# Patient Record
Sex: Female | Born: 1970
Health system: Southern US, Community
[De-identification: ages and names within clinical notes are randomized; demographics above are authoritative.]

## PROBLEM LIST (undated history)

## (undated) DIAGNOSIS — R87619 Unspecified abnormal cytological findings in specimens from cervix uteri: Secondary | ICD-10-CM

## (undated) DIAGNOSIS — N83209 Unspecified ovarian cyst, unspecified side: Secondary | ICD-10-CM

## (undated) DIAGNOSIS — D649 Anemia, unspecified: Secondary | ICD-10-CM

## (undated) DIAGNOSIS — F419 Anxiety disorder, unspecified: Secondary | ICD-10-CM

## (undated) DIAGNOSIS — G43909 Migraine, unspecified, not intractable, without status migrainosus: Secondary | ICD-10-CM

## (undated) DIAGNOSIS — F1910 Other psychoactive substance abuse, uncomplicated: Secondary | ICD-10-CM

## (undated) DIAGNOSIS — Z800 Family history of malignant neoplasm of digestive organs: Secondary | ICD-10-CM

## (undated) DIAGNOSIS — Z5189 Encounter for other specified aftercare: Secondary | ICD-10-CM

## (undated) DIAGNOSIS — I10 Essential (primary) hypertension: Secondary | ICD-10-CM

## (undated) DIAGNOSIS — E785 Hyperlipidemia, unspecified: Secondary | ICD-10-CM

## (undated) HISTORY — DX: Anemia, unspecified: D64.9

## (undated) HISTORY — PX: TUBAL LIGATION: SHX77

## (undated) HISTORY — DX: Other psychoactive substance abuse, uncomplicated: F19.10

## (undated) HISTORY — DX: Anxiety disorder, unspecified: F41.9

## (undated) HISTORY — PX: CHOLECYSTECTOMY: SHX55

## (undated) HISTORY — DX: Family history of malignant neoplasm of digestive organs: Z80.0

## (undated) HISTORY — DX: Unspecified ovarian cyst, unspecified side: N83.209

## (undated) HISTORY — DX: Hyperlipidemia, unspecified: E78.5

## (undated) HISTORY — DX: Encounter for other specified aftercare: Z51.89

## (undated) HISTORY — PX: CERVICAL BIOPSY  W/ LOOP ELECTRODE EXCISION: SUR135

## (undated) HISTORY — DX: Unspecified abnormal cytological findings in specimens from cervix uteri: R87.619

## (undated) HISTORY — DX: Migraine, unspecified, not intractable, without status migrainosus: G43.909

---

## 2011-04-24 HISTORY — PX: DEEP NECK LYMPH NODE BIOPSY / EXCISION: SUR126

## 2019-04-24 DIAGNOSIS — Z5189 Encounter for other specified aftercare: Secondary | ICD-10-CM

## 2019-04-24 HISTORY — DX: Encounter for other specified aftercare: Z51.89

## 2019-06-08 ENCOUNTER — Ambulatory Visit: Payer: Self-pay | Attending: Internal Medicine

## 2019-06-08 DIAGNOSIS — Z20822 Contact with and (suspected) exposure to covid-19: Secondary | ICD-10-CM

## 2019-06-09 LAB — NOVEL CORONAVIRUS, NAA: SARS-CoV-2, NAA: NOT DETECTED

## 2019-07-16 ENCOUNTER — Ambulatory Visit: Payer: Self-pay

## 2019-07-17 ENCOUNTER — Ambulatory Visit: Payer: Self-pay | Attending: Internal Medicine

## 2019-07-17 DIAGNOSIS — Z20822 Contact with and (suspected) exposure to covid-19: Secondary | ICD-10-CM

## 2019-07-18 LAB — SARS-COV-2, NAA 2 DAY TAT

## 2019-07-18 LAB — NOVEL CORONAVIRUS, NAA: SARS-CoV-2, NAA: NOT DETECTED

## 2019-09-03 ENCOUNTER — Encounter (HOSPITAL_COMMUNITY): Payer: Self-pay

## 2019-09-03 ENCOUNTER — Ambulatory Visit (HOSPITAL_COMMUNITY)
Admission: EM | Admit: 2019-09-03 | Discharge: 2019-09-03 | Disposition: A | Discharge status: Home / Self Care | Payer: No Typology Code available for payment source | Attending: Family Medicine | Admitting: Family Medicine

## 2019-09-03 ENCOUNTER — Ambulatory Visit (INDEPENDENT_AMBULATORY_CARE_PROVIDER_SITE_OTHER): Payer: No Typology Code available for payment source

## 2019-09-03 ENCOUNTER — Other Ambulatory Visit: Payer: Self-pay

## 2019-09-03 DIAGNOSIS — M25512 Pain in left shoulder: Secondary | ICD-10-CM | POA: Diagnosis not present

## 2019-09-03 DIAGNOSIS — S161XXA Strain of muscle, fascia and tendon at neck level, initial encounter: Secondary | ICD-10-CM

## 2019-09-03 HISTORY — DX: Essential (primary) hypertension: I10

## 2019-09-03 MED ORDER — IBUPROFEN 800 MG PO TABS: 800.0000 mg | ORAL_TABLET | Freq: Three times a day (TID) | ORAL | 0 refills | Status: DC

## 2019-09-03 MED ORDER — CYCLOBENZAPRINE HCL 10 MG PO TABS: ORAL_TABLET | ORAL | 0 refills | Status: DC
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
## 2019-10-26 ENCOUNTER — Ambulatory Visit: Payer: No Typology Code available for payment source | Attending: Internal Medicine

## 2019-10-26 DIAGNOSIS — Z20822 Contact with and (suspected) exposure to covid-19: Secondary | ICD-10-CM

## 2019-10-27 LAB — SARS-COV-2, NAA 2 DAY TAT

## 2019-10-27 LAB — NOVEL CORONAVIRUS, NAA: SARS-CoV-2, NAA: NOT DETECTED

## 2020-04-28 ENCOUNTER — Other Ambulatory Visit: Payer: No Typology Code available for payment source

## 2020-05-20 ENCOUNTER — Encounter: Payer: Self-pay | Admitting: Family Medicine

## 2020-05-20 ENCOUNTER — Ambulatory Visit (INDEPENDENT_AMBULATORY_CARE_PROVIDER_SITE_OTHER): Payer: No Typology Code available for payment source | Admitting: Family Medicine

## 2020-05-20 ENCOUNTER — Ambulatory Visit: Payer: Self-pay | Admitting: Family Medicine

## 2020-05-20 ENCOUNTER — Other Ambulatory Visit: Payer: Self-pay | Admitting: Family Medicine

## 2020-05-20 ENCOUNTER — Other Ambulatory Visit: Payer: Self-pay

## 2020-05-20 VITALS — BP 140/70 | HR 70 | Ht 64.5 in | Wt 154.0 lb

## 2020-05-20 DIAGNOSIS — Z13220 Encounter for screening for lipoid disorders: Secondary | ICD-10-CM

## 2020-05-20 DIAGNOSIS — Z0000 Encounter for general adult medical examination without abnormal findings: Secondary | ICD-10-CM

## 2020-05-20 DIAGNOSIS — Z124 Encounter for screening for malignant neoplasm of cervix: Secondary | ICD-10-CM

## 2020-05-20 DIAGNOSIS — Z1159 Encounter for screening for other viral diseases: Secondary | ICD-10-CM

## 2020-05-20 DIAGNOSIS — Z1231 Encounter for screening mammogram for malignant neoplasm of breast: Secondary | ICD-10-CM | POA: Diagnosis not present

## 2020-05-20 DIAGNOSIS — Z833 Family history of diabetes mellitus: Secondary | ICD-10-CM

## 2020-05-20 DIAGNOSIS — Z7185 Encounter for immunization safety counseling: Secondary | ICD-10-CM

## 2020-05-20 DIAGNOSIS — Z1211 Encounter for screening for malignant neoplasm of colon: Secondary | ICD-10-CM | POA: Diagnosis not present

## 2020-05-20 DIAGNOSIS — D649 Anemia, unspecified: Secondary | ICD-10-CM

## 2020-05-20 DIAGNOSIS — N920 Excessive and frequent menstruation with regular cycle: Secondary | ICD-10-CM | POA: Diagnosis not present

## 2020-05-20 DIAGNOSIS — Z1329 Encounter for screening for other suspected endocrine disorder: Secondary | ICD-10-CM

## 2020-05-20 DIAGNOSIS — Z8616 Personal history of COVID-19: Secondary | ICD-10-CM

## 2020-05-20 DIAGNOSIS — I10 Essential (primary) hypertension: Secondary | ICD-10-CM

## 2020-05-20 MED ORDER — AMLODIPINE BESYLATE 10 MG PO TABS: 10.0000 mg | ORAL_TABLET | Freq: Every day | ORAL | 1 refills | Status: DC

## 2020-05-20 MED FILL — AMLODIPINE BESYLATE 10 MG T: 10 | 90 days supply | Qty: 90 | Fill #0

## 2020-05-20 NOTE — Progress Notes (Signed)
Subjective:    Patient ID: Pamela Todd, female    DOB: April 21, 1971, 50 y.o.   MRN: [id number]  HPI Chief Complaint  Patient presents with  . new pt    New pt cpe, been with bp meds for a while. Would like referral to gyn   She is new to the practice and here for a complete physical exam. Previous medical care: moved here from [place] 3 years ago   HTN- has been out of her amlodipine 10 mg for 2 months.   History of anemia 4 years ago due to menstrual bleeding. Had a blood transfusion in the past.  States periods are heavier than in the past.  Requests referral to gynecologist.   Social history: Lives with , works as a Biomedical engineer in oncology at [employer]  Denies smoking, drinking alcohol, drug use  Diet: eats whatever she wants  Excerise: not often   Immunizations: Covid vaccines received. Declines flu shot.   Health maintenance:  Mammogram: 6 years ago  Colonoscopy: never  Last Gynecological Exam: overdue  Last Menstrual cycle: currently on cycle. Periods heavier than usual.  Last Dental Exam: overdue Last Eye Exam: 3 months   Wears seatbelt always, smoke detectors in home and functioning, does not text while driving and feels safe in home environment.   Reviewed allergies, medications, past medical, surgical, family, and social history.   Review of Systems Review of Systems Constitutional: -fever, -chills, -sweats, -unexpected weight change,-fatigue ENT: -runny nose, -ear pain, -sore throat Cardiology:  -chest pain, -palpitations, -edema Respiratory: -cough, -shortness of breath, -wheezing Gastroenterology: -abdominal pain, -nausea, -vomiting, -diarrhea, -constipation  Hematology: -bleeding or bruising problems Musculoskeletal: -arthralgias, -myalgias, -joint swelling, -back pain Ophthalmology: -vision changes Urology: -dysuria, -difficulty urinating, -hematuria, -urinary frequency, -urgency Neurology: -headache, -weakness, -tingling, -numbness       Objective:   Physical Exam BP 140/70   Pulse 70   Ht 5' 4.5" (1.638 m)   Wt 154 lb (69.9 kg)   LMP 04/20/2020   BMI 26.03 kg/m   General Appearance:    Alert, cooperative, no distress, appears stated age  Head:    Normocephalic, without obvious abnormality, atraumatic  Eyes:    PERRL, conjunctiva/corneas clear, EOM's intact  Ears:    Normal TM's and external ear canals  Nose:   Mask on   Throat:   Mask on   Neck:   Supple, no lymphadenopathy;  thyroid:  no   enlargement/tenderness/nodules; no JVD  Back:    Spine nontender, no curvature, ROM normal, no CVA     tenderness  Lungs:     Clear to auscultation bilaterally without wheezes, rales or     ronchi; respirations unlabored  Chest Wall:    No tenderness or deformity   Heart:    Regular rate and rhythm, S1 and S2 normal, no murmur, rub   or gallop  Breast Exam:    OB/GYN   Abdomen:     Soft, non-tender, nondistended, normoactive bowel sounds,    no masses, no hepatosplenomegaly  Genitalia:    OB/GYN      Extremities:   No clubbing, cyanosis or edema  Pulses:   2+ and symmetric all extremities  Skin:   Skin color, texture, turgor normal, no rashes or lesions  Lymph nodes:   Cervical, supraclavicular, and axillary nodes normal  Neurologic:   CNII-XII intact, normal strength, sensation and gait; reflexes 2+ and symmetric throughout          Psych:  Normal mood, affect, hygiene and grooming.        Assessment & Plan:  Routine general medical examination at a health care facility - Plan: CBC with Differential/Platelet, Comprehensive metabolic panel, TSH, T4, free, T3, Lipid panel -She is new to the practice and here today for a fasting CPE.  Preventive health care deficiencies. She will call and schedule her mammogram at the breast center.  I am referring her to [facility] gynecology per her request. Referral made to GI for her for screening colonoscopy. She will call and schedule with a local dentist and eye  doctor. Counseling on healthy lifestyle including diet and exercise. Immunizations reviewed Discussed safety and health promotion.  Primary hypertension - Plan: CBC with Differential/Platelet, Comprehensive metabolic panel, amLODipine (NORVASC) 10 MG tablet -She will start back on amlodipine and monitor blood pressure.  Recommend low-sodium diet.  Follow-up pending lab results  Anemia, unspecified type - Plan: CBC with Differential/Platelet, Iron, TIBC and Ferritin Panel -She is not currently taking iron.  History of blood transfusion.  Her menstrual cycles are much heavier now that she is closer to menopause.  I will check CBC and iron studies and follow-up  Encounter for screening mammogram for malignant neoplasm of breast - Plan: MM DIGITAL SCREENING BILATERAL -She will call and schedule her mammogram  Screen for colon cancer - Plan: Ambulatory referral to Gastroenterology -Done per screening guidelines  Immunization counseling  Family history of diabetes mellitus (DM) - Plan: Hemoglobin A1c -Recommend low sugar and low carbohydrate diet and increasing physical activity.  Need for hepatitis C screening test - Plan: Hepatitis C antibody  History of COVID-19 -Fully recovered  Screening for lipid disorders - Plan: Lipid panel  Screening for thyroid disorder - Plan: TSH, T4, free, T3  Screening for cervical cancer - Plan: Ambulatory referral to Gynecology  Menorrhagia with regular cycle - Plan: Ambulatory referral to Gynecology

## 2020-05-20 NOTE — Patient Instructions (Addendum)
It was a pleasure meeting you today.  Call and schedule your mammogram at the breast center.  You will hear from [facility] gastroenterology and [facility] gynecology.  Try to get at least 150 minutes of physical activity each week.  Limit sodium, foods high in fat and carbohydrates.  Start back on your amlodipine and check your blood pressure periodically. Goal blood pressure reading is less than 130/80. Let me know if it is not consistently lower than this. It may take a few days to get it to normal range.  Call and schedule an eye exam.  Call and schedule a dental exam.  We will be in touch with your lab results.  You may return at your convenience for a nurse visit to get your Covid booster.      Preventive Care 43-52 Years Old, Female Preventive care refers to lifestyle choices and visits with your health care provider that can promote health and wellness. This includes:  A yearly physical exam. This is also called an annual wellness visit.  Regular dental and eye exams.  Immunizations.  Screening for certain conditions.  Healthy lifestyle choices, such as: ? Eating a healthy diet. ? Getting regular exercise. ? Not using drugs or products that contain nicotine and tobacco. ? Limiting alcohol use. What can I expect for my preventive care visit? Physical exam Your health care provider will check your:  Height and weight. These may be used to calculate your BMI (body mass index). BMI is a measurement that tells if you are at a healthy weight.  Heart rate and blood pressure.  Body temperature.  Skin for abnormal spots. Counseling Your health care provider may ask you questions about your:  Past medical problems.  Family's medical history.  Alcohol, tobacco, and drug use.  Emotional well-being.  Home life and relationship well-being.  Sexual activity.  Diet, exercise, and sleep habits.  Work and work Statistician.  Access to firearms.  Method of  birth control.  Menstrual cycle.  Pregnancy history. What immunizations do I need? Vaccines are usually given at various ages, according to a schedule. Your health care provider will recommend vaccines for you based on your age, medical history, and lifestyle or other factors, such as travel or where you work.   What tests do I need? Blood tests  Lipid and cholesterol levels. These may be checked every 5 years, or more often if you are over 15 years old.  Hepatitis C test.  Hepatitis B test. Screening  Lung cancer screening. You may have this screening every year starting at age 28 if you have a 30-pack-year history of smoking and currently smoke or have quit within the past 15 years.  Colorectal cancer screening. ? All adults should have this screening starting at age 72 and continuing until age 54. ? Your health care provider may recommend screening at age 30 if you are at increased risk. ? You will have tests every 1-10 years, depending on your results and the type of screening test.  Diabetes screening. ? This is done by checking your blood sugar (glucose) after you have not eaten for a while (fasting). ? You may have this done every 1-3 years.  Mammogram. ? This may be done every 1-2 years. ? Talk with your health care provider about when you should start having regular mammograms. This may depend on whether you have a family history of breast cancer.  BRCA-related cancer screening. This may be done if you have a family history of  breast, ovarian, tubal, or peritoneal cancers.  Pelvic exam and Pap test. ? This may be done every 3 years starting at age 104. ? Starting at age 61, this may be done every 5 years if you have a Pap test in combination with an HPV test. Other tests  STD (sexually transmitted disease) testing, if you are at risk.  Bone density scan. This is done to screen for osteoporosis. You may have this scan if you are at high risk for osteoporosis. Talk  with your health care provider about your test results, treatment options, and if necessary, the need for more tests. Follow these instructions at home: Eating and drinking  Eat a diet that includes fresh fruits and vegetables, whole grains, lean protein, and low-fat dairy products.  Take vitamin and mineral supplements as recommended by your health care provider.  Do not drink alcohol if: ? Your health care provider tells you not to drink. ? You are pregnant, may be pregnant, or are planning to become pregnant.  If you drink alcohol: ? Limit how much you have to 0-1 drink a day. ? Be aware of how much alcohol is in your drink. In the [place] one drink equals one 12 oz bottle of beer (355 mL), one 5 oz glass of wine (148 mL), or one 1 oz glass of hard liquor (44 mL).   Lifestyle  Take daily care of your teeth and gums. Brush your teeth every morning and night with fluoride toothpaste. Floss one time each day.  Stay active. Exercise for at least 30 minutes 5 or more days each week.  Do not use any products that contain nicotine or tobacco, such as cigarettes, e-cigarettes, and chewing tobacco. If you need help quitting, ask your health care provider.  Do not use drugs.  If you are sexually active, practice safe sex. Use a condom or other form of protection to prevent STIs (sexually transmitted infections).  If you do not wish to become pregnant, use a form of birth control. If you plan to become pregnant, see your health care provider for a prepregnancy visit.  If told by your health care provider, take low-dose aspirin daily starting at age 94.  Find healthy ways to cope with stress, such as: ? Meditation, yoga, or listening to music. ? Journaling. ? Talking to a trusted person. ? Spending time with friends and family. Safety  Always wear your seat belt while driving or riding in a vehicle.  Do not drive: ? If you have been drinking alcohol. Do not ride with someone who has  been drinking. ? When you are tired or distracted. ? While texting.  Wear a helmet and other protective equipment during sports activities.  If you have firearms in your house, make sure you follow all gun safety procedures. What's next?  Visit your health care provider once a year for an annual wellness visit.  Ask your health care provider how often you should have your eyes and teeth checked.  Stay up to date on all vaccines. This information is not intended to replace advice given to you by your health care provider. Make sure you discuss any questions you have with your health care provider. Document Revised: 01/12/2020 Document Reviewed: 12/19/2017 Elsevier Patient Education  2021 [employer].

## 2020-05-21 LAB — HEMOGLOBIN A1C
Est. average glucose Bld gHb Est-mCnc: 120 mg/dL
Hgb A1c MFr Bld: 5.8 % — ABNORMAL HIGH (ref 4.8–5.6)

## 2020-05-21 LAB — LIPID PANEL
Chol/HDL Ratio: 4.8 ratio — ABNORMAL HIGH (ref 0.0–4.4)
Cholesterol, Total: 238 mg/dL — ABNORMAL HIGH (ref 100–199)
HDL: 50 mg/dL (ref >39)
LDL Chol Calc (NIH): 167 mg/dL — ABNORMAL HIGH (ref 0–99)
Triglycerides: 116 mg/dL (ref 0–149)
VLDL Cholesterol Cal: 21 mg/dL (ref 5–40)

## 2020-05-21 LAB — COMPREHENSIVE METABOLIC PANEL
ALT: 14 IU/L (ref 0–32)
AST: 17 IU/L (ref 0–40)
Albumin/Globulin Ratio: 1.3 (ref 1.2–2.2)
Albumin: 4.1 g/dL (ref 3.8–4.9)
Alkaline Phosphatase: 77 IU/L (ref 44–121)
BUN/Creatinine Ratio: 10 (ref 9–23)
BUN: 6 mg/dL (ref 6–24)
Bilirubin Total: 0.3 mg/dL (ref 0.0–1.2)
CO2: 23 mmol/L (ref 20–29)
Calcium: 9.7 mg/dL (ref 8.7–10.2)
Chloride: 104 mmol/L (ref 96–106)
Creatinine, Ser: 0.63 mg/dL (ref 0.57–1.00)
GFR calc Af Amer: 120 mL/min/{1.73_m2} (ref >59)
GFR calc non Af Amer: 104 mL/min/{1.73_m2} (ref >59)
Globulin, Total: 3.2 g/dL (ref 1.5–4.5)
Glucose: 108 mg/dL — ABNORMAL HIGH (ref 65–99)
Potassium: 4.4 mmol/L (ref 3.5–5.2)
Sodium: 141 mmol/L (ref 134–144)
Total Protein: 7.3 g/dL (ref 6.0–8.5)

## 2020-05-21 LAB — IRON,TIBC AND FERRITIN PANEL
Ferritin: 9 ng/mL — ABNORMAL LOW (ref 15–150)
Iron Saturation: 8 % — CL (ref 15–55)
Iron: 29 ug/dL (ref 27–159)
Total Iron Binding Capacity: 385 ug/dL (ref 250–450)
UIBC: 356 ug/dL (ref 131–425)

## 2020-05-21 LAB — TSH: TSH: 1.94 u[IU]/mL (ref 0.450–4.500)

## 2020-05-21 LAB — CBC WITH DIFFERENTIAL/PLATELET
Basophils Absolute: 0 10*3/uL (ref 0.0–0.2)
Basos: 1 %
EOS (ABSOLUTE): 0.4 10*3/uL (ref 0.0–0.4)
Eos: 7 %
Hematocrit: 36.5 % (ref 34.0–46.6)
Hemoglobin: 11.2 g/dL (ref 11.1–15.9)
Immature Grans (Abs): 0 10*3/uL (ref 0.0–0.1)
Immature Granulocytes: 0 %
Lymphocytes Absolute: 2.3 10*3/uL (ref 0.7–3.1)
Lymphs: 42 %
MCH: 22.9 pg — ABNORMAL LOW (ref 26.6–33.0)
MCHC: 30.7 g/dL — ABNORMAL LOW (ref 31.5–35.7)
MCV: 75 fL — ABNORMAL LOW (ref 79–97)
Monocytes Absolute: 0.4 10*3/uL (ref 0.1–0.9)
Monocytes: 8 %
Neutrophils Absolute: 2.3 10*3/uL (ref 1.4–7.0)
Neutrophils: 42 %
Platelets: 531 10*3/uL — ABNORMAL HIGH (ref 150–450)
RBC: 4.89 x10E6/uL (ref 3.77–5.28)
RDW: 15.3 % (ref 11.7–15.4)
WBC: 5.4 10*3/uL (ref 3.4–10.8)

## 2020-05-21 LAB — HEPATITIS C ANTIBODY: Hep C Virus Ab: <0.1 s/co ratio (ref 0.0–0.9)

## 2020-05-21 LAB — T4, FREE: Free T4: 1.13 ng/dL (ref 0.82–1.77)

## 2020-05-21 LAB — T3: T3, Total: 165 ng/dL (ref 71–180)

## 2020-05-23 ENCOUNTER — Encounter: Payer: Self-pay | Admitting: Family Medicine

## 2020-05-23 DIAGNOSIS — E7800 Pure hypercholesterolemia, unspecified: Secondary | ICD-10-CM | POA: Insufficient documentation

## 2020-05-23 DIAGNOSIS — R7303 Prediabetes: Secondary | ICD-10-CM

## 2020-05-23 DIAGNOSIS — E611 Iron deficiency: Secondary | ICD-10-CM

## 2020-05-23 DIAGNOSIS — D75839 Thrombocytosis, unspecified: Secondary | ICD-10-CM

## 2020-05-23 HISTORY — DX: Iron deficiency: E61.1

## 2020-05-23 HISTORY — DX: Prediabetes: R73.03

## 2020-05-23 HISTORY — DX: Pure hypercholesterolemia, unspecified: E78.00

## 2020-05-23 HISTORY — DX: Thrombocytosis, unspecified: D75.839

## 2020-05-23 NOTE — Progress Notes (Signed)
Please call her and let her know that her iron level is very low. I recommend that she take OTC iron daily. It may cause constipation so I recommend taking a stool softener with each iron dose Her platelets are also significantly elevated and we need to recheck these in approximately 4 weeks after she has been taking iron daily.  Also, she is in the prediabetes range with a Hgb A1c of 5.8%. Prediabetes is 5.7-6.4 and diabetes starts at 6.5%.  Her LDL is 167 which is also high and the goal is less than 100.  We can talk more about all of this at her follow up in 4 weeks but essentially this means that cutting back on sugar, carbohydrates and foods high in fat including fried foods will help improve her blood work and her overall health.

## 2020-06-02 ENCOUNTER — Other Ambulatory Visit (HOSPITAL_COMMUNITY)
Admission: RE | Admit: 2020-06-02 | Discharge: 2020-06-02 | Disposition: A | Discharge status: Home / Self Care | Payer: No Typology Code available for payment source | Source: Ambulatory Visit | Attending: Nurse Practitioner | Admitting: Nurse Practitioner

## 2020-06-02 ENCOUNTER — Other Ambulatory Visit: Payer: Self-pay | Admitting: Nurse Practitioner

## 2020-06-02 ENCOUNTER — Ambulatory Visit (INDEPENDENT_AMBULATORY_CARE_PROVIDER_SITE_OTHER): Payer: No Typology Code available for payment source | Admitting: Nurse Practitioner

## 2020-06-02 ENCOUNTER — Other Ambulatory Visit: Payer: Self-pay

## 2020-06-02 ENCOUNTER — Encounter: Payer: Self-pay | Admitting: Nurse Practitioner

## 2020-06-02 VITALS — BP 110/70 | HR 68 | Resp 16 | Ht 63.25 in | Wt 150.0 lb

## 2020-06-02 DIAGNOSIS — Z113 Encounter for screening for infections with a predominantly sexual mode of transmission: Secondary | ICD-10-CM | POA: Diagnosis present

## 2020-06-02 DIAGNOSIS — Z01419 Encounter for gynecological examination (general) (routine) without abnormal findings: Secondary | ICD-10-CM | POA: Diagnosis present

## 2020-06-02 DIAGNOSIS — N898 Other specified noninflammatory disorders of vagina: Secondary | ICD-10-CM | POA: Diagnosis not present

## 2020-06-02 DIAGNOSIS — N920 Excessive and frequent menstruation with regular cycle: Secondary | ICD-10-CM | POA: Diagnosis not present

## 2020-06-02 LAB — WET PREP FOR TRICH, YEAST, CLUE

## 2020-06-02 MED ORDER — NORETHINDRONE 0.35 MG PO TABS: 1.0000 | ORAL_TABLET | Freq: Every day | ORAL | 12 refills | Status: DC

## 2020-06-02 MED FILL — NORLYDA 0.35 MG TABS: 0.35 | 28 days supply | Qty: 28 | Fill #0

## 2020-06-02 NOTE — Progress Notes (Signed)
50 y.o. [id number] Seperated Black or African American female here for annual exam.   Moved from [place] 3 years ago, received GYN care last back home.  Lives with son, daughter in law and 2 grandchildren. 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  Wants to discuss a vaginal discharge (increased amount), no odor, no irritation, but a change for her. It has been going on about 1 year. Has new sexual partner, off/on  Periods regular about every 28 days, lasts about 7 days. Last period lasted 9 days. Periods are generally heavy x 3 days, last period was heavy for 5 days and also noticed cramps. Past 2-3 years periods have been getting heavier. No intermenstrual bleeding. Recent blood work with PCP hemoglobin 11.2    Patient's last menstrual period was 05/18/2020 (exact date).          Sexually active: Yes.    The current method of family planning is tubal ligation.    Exercising: No.  exercise Smoker:  no  Health Maintenance: Pap:  4-5 yrs ago History of abnormal Pap:  Yes, [place] 02/2015 MMG:  86yrs ago, patient to schedule Colonoscopy:  None, pcp is setting up appt BMD:   none TDaP:  2020 Gardasil:   n/a Covid-19: pfizer Hep C testing: neg 2022 Screening Labs: done with PCPs   reports that she has never smoked. She has never used smokeless tobacco. She reports previous alcohol use. She reports that she does not use drugs.  Past Medical History:  Diagnosis Date  . Abnormal Pap smear of cervix   . Anemia   . Anxiety   . Blood transfusion without reported diagnosis   . Elevated LDL cholesterol level 05/23/2020  . Hypertension   . Iron deficiency 05/23/2020  . Migraines   . Ovarian cyst   . Prediabetes 05/23/2020  . Substance abuse ([place])    trichomonas many yrs ago  . Thrombocytosis 05/23/2020    Past Surgical History:  Procedure Laterality Date  . CERVICAL BIOPSY  W/ LOOP ELECTRODE EXCISION     2016  . CHOLECYSTECTOMY    . DEEP NECK LYMPH NODE BIOPSY / EXCISION     . TUBAL LIGATION      Current Outpatient Medications  Medication Sig Dispense Refill  . amLODipine (NORVASC) 10 MG tablet Take 1 tablet (10 mg total) by mouth daily. 90 tablet 1  . Ferrous Sulfate (IRON PO) Take by mouth.     No current facility-administered medications for this visit.    Family History  Problem Relation Age of Onset  . Hypertension Mother   . Colon cancer Mother 3  . Heart disease Mother   . Hypertension Father   . Diabetes Sister   . Hypertension Sister   . Diabetes Brother   . Heart attack Brother   . Stroke Brother   . Diabetes Sister   . Diabetes Brother   . Heart disease Maternal Grandmother     Review of Systems  Constitutional: Negative.   HENT: Negative.   Eyes: Negative.   Respiratory: Negative.   Cardiovascular: Negative.   Gastrointestinal: Negative.   Endocrine: Negative.   Genitourinary: Negative.   Musculoskeletal: Negative.   Skin: Negative.   Allergic/Immunologic: Negative.   Neurological: Negative.   Hematological: Negative.   Psychiatric/Behavioral: Negative.     Exam:   BP 110/70   Pulse 68   Resp 16   Ht 5' 3.25" (1.607 m)   Wt 150 lb (68 kg)   LMP  05/18/2020 (Exact Date)   BMI 26.36 kg/m   Height: 5' 3.25" (160.7 cm)  General appearance: alert, cooperative and appears stated age, no acute distress Head: Normocephalic, without obvious abnormality Neck: no adenopathy, thyroid normal to inspection and palpation Lungs: clear to auscultation bilaterally Breasts: Normal to palpation without dominant masses, Taught monthly breast self examination Heart: regular rate and rhythm Abdomen: soft, non-tender; no masses,  no organomegaly Extremities: extremities normal, no edema Skin: No rashes or lesions Lymph nodes: Cervical, supraclavicular, and axillary nodes normal. No abnormal inguinal nodes palpated Neurologic: Grossly normal   Pelvic: External genitalia:  no lesions              Urethra:  normal appearing urethra  with no masses, tenderness or lesions              Bartholins and Skenes: normal                 Vagina: normal appearing vagina, appropriate for age, watery appearing discharge, no lesions              Cervix: soft cystic structure palpable and visible on cervix             Bimanual Exam:   Uterus:  Irregular in shape              Adnexa: no mass, fullness, tenderness              Rectal: no palpable mass  Joy, CMA Chaperone was present for exam.  A:  Well woman exam with routine gynecological exam - Plan: Cytology - PAP( [place])  Menorrhagia with regular cycle - Plan: norethindrone (MICRONOR) 0.35 MG tablet, US PELVIS TRANSVAGINAL NON-OB (TV ONLY)  Vaginal discharge - Plan: WET PREP FOR [place], YEAST, CLUE  Screening for STDs (sexually transmitted diseases) - Plan: WET PREP FOR [place], YEAST, CLUE, Cytology - PAP( [place])   P:   Pap : collected today/HPV, Hx LEEP 2016  Mammogram:Pt will schedule, discussed  Labs:GC/CT, wet mount today (blood testing recently done with PCP)  Medications: Norethindrone 0.35mg , will rx month to month in case pt does not tolerate. Advised after follow up can discuss management plan further with MD.  Briefly discussed IUD as possible option after [place], informational pamphlet provided

## 2020-06-02 NOTE — Patient Instructions (Addendum)
Menorrhagia Menorrhagia is when your monthly periods are heavy or last longer than normal. If you have this condition, bleeding and cramping may make it hard for you to do your daily activities. What are the causes? Common causes of this condition include:  Growths in the womb (uterus). These are polyps or fibroids. These growths are not cancer.  Problems with two hormones called estrogen and progesterone.  One of the ovaries not releasing an egg during one or more months.  A problem with the thyroid gland.  Having a device for birth control (IUD).  Side effects of some medicines, such as NSAIDs or blood thinners.  A disorder that stops the blood from clotting normally. What increases the risk? You are more likely to have this condition if you have cancer of the womb. What are the signs or symptoms?  Having to change your pad or tampon every 1-2 hours because it is soaked.  Needing to use pads and tampons at the same time because of heavy bleeding.  Needing to wake up to change your pads or tampons during the night.  Passing blood clots larger than 1 inch (2.5 cm) in size.  Having bleeding that lasts for more than 7 days.  Having symptoms of low iron levels (anemia), such as feeling tired or having shortness of breath. How is this treated? You may not need to be treated for this condition. But if you need treatment, you may be given medicines:  To reduce bleeding during your period. These include birth control medicines.  To make your blood thick. This slows bleeding.  To reduce swelling. Medicines that do this include ibuprofen.  That have a hormone called progestin.  That make the ovaries stop working for a short time.  To treat low iron levels. You will be given iron pills if you have this condition. If medicines do not work, surgery may be done. Surgery may be done to:  Remove a part of the lining of the womb. This lining is called the endometrium. This reduces  bleeding during a period.  Remove growths in the womb. These may be polyps or fibroids.  Remove the entire lining of the womb.  Remove the womb entirely. This procedure is called a hysterectomy.   Follow these instructions at home: Medicines  Take over-the-counter and prescription medicines only as told by your doctor. This includes iron pills.  Do not change or switch medicines without asking your doctor.  Do not take aspirin or medicines that contain aspirin 1 week before or during your period. Aspirin may make bleeding worse. Managing constipation Iron pills may cause trouble pooping (constipation). To prevent or treat problems when pooping, you may need to:  Drink enough fluid to keep your pee (urine) pale yellow.  Take over-the-counter or prescription medicines.  Eat foods that are high in fiber. These include beans, whole grains, and fresh fruits and vegetables.  Limit foods that are high in fat and sugar. These include fried or sweet foods. General instructions  If you need to change your pad or tampon more than once every 2 hours, limit your activity until the bleeding stops.  Eat healthy meals and foods that are high in iron. Foods that have a lot of iron include: ? Leafy green vegetables. ? Meat. ? Liver. ? Eggs. ? Whole-grain breads and cereals.  Do not try to lose weight until your heavy bleeding has stopped and you have normal amounts of iron in your blood. If you need to lose  weight, work with your doctor.  Keep all follow-up visits. Contact a doctor if:  You soak through a pad or tampon every 1 or 2 hours, and this happens every time you have a period.  You need to use pads and tampons at the same time because you are bleeding so much.  You are taking medicine, and: ? You feel like you may vomit. ? You vomit. ? You have watery poop (diarrhea).  You have other problems that may be related to the medicine you are taking. Get help right away if:  You  soak through more than a pad or tampon in 1 hour.  You pass clots bigger than 1 inch (2.5 cm) wide.  You feel short of breath.  You feel like your heart is beating too fast.  You feel dizzy or you faint.  You feel very weak or tired. Summary  Menorrhagia is when your menstrual periods are heavy or last longer than normal.  You may not need to be treated for this condition. If you need treatment, you may be given medicines or have surgery.  Take over-the-counter and prescription medicines only as told by your doctor. This includes iron pills.  Get help right away if you soak through more than a pad or tampon in 1 hour or you pass large clots. Also, get help right away if you feel dizzy, short of breath, or very weak or tired. This information is not intended to replace advice given to you by your health care provider. Make sure you discuss any questions you have with your health care provider. Document Revised: 12/22/2019 Document Reviewed: 12/22/2019 Elsevier Patient Education  2021 [employer]    Preventive Care 21-77 Years Old, Female Preventive care refers to lifestyle choices and visits with your health care provider that can promote health and wellness. This includes:  A yearly physical exam. This is also called an annual wellness visit.  Regular dental and eye exams.  Immunizations.  Screening for certain conditions.  Healthy lifestyle choices, such as: ? Eating a healthy diet. ? Getting regular exercise. ? Not using drugs or products that contain nicotine and tobacco. ? Limiting alcohol use. What can I expect for my preventive care visit? Physical exam Your health care provider will check your:  Height and weight. These may be used to calculate your BMI (body mass index). BMI is a measurement that tells if you are at a healthy weight.  Heart rate and blood pressure.  Body temperature.  Skin for abnormal spots. Counseling Your health care provider may  ask you questions about your:  Past medical problems.  Family's medical history.  Alcohol, tobacco, and drug use.  Emotional well-being.  Home life and relationship well-being.  Sexual activity.  Diet, exercise, and sleep habits.  Work and work Statistician.  Access to firearms.  Method of birth control.  Menstrual cycle.  Pregnancy history. What immunizations do I need? Vaccines are usually given at various ages, according to a schedule. Your health care provider will recommend vaccines for you based on your age, medical history, and lifestyle or other factors, such as travel or where you work.   What tests do I need? Blood tests  Lipid and cholesterol levels. These may be checked every 5 years, or more often if you are over 64 years old.  Hepatitis C test.  Hepatitis B test. Screening  Lung cancer screening. You may have this screening every year starting at age 3 if you have a 30-pack-year  history of smoking and currently smoke or have quit within the past 15 years.  Colorectal cancer screening. ? All adults should have this screening starting at age 85 and continuing until age 64. ? Your health care provider may recommend screening at age 60 if you are at increased risk. ? You will have tests every 1-10 years, depending on your results and the type of screening test.  Diabetes screening. ? This is done by checking your blood sugar (glucose) after you have not eaten for a while (fasting). ? You may have this done every 1-3 years.  Mammogram. ? This may be done every 1-2 years. ? Talk with your health care provider about when you should start having regular mammograms. This may depend on whether you have a family history of breast cancer.  BRCA-related cancer screening. This may be done if you have a family history of breast, ovarian, tubal, or peritoneal cancers.  Pelvic exam and Pap test. ? This may be done every 3 years starting at age 12. ? Starting at  age 47, this may be done every 5 years if you have a Pap test in combination with an HPV test. Other tests  STD (sexually transmitted disease) testing, if you are at risk.  Bone density scan. This is done to screen for osteoporosis. You may have this scan if you are at high risk for osteoporosis. Talk with your health care provider about your test results, treatment options, and if necessary, the need for more tests. Follow these instructions at home: Eating and drinking  Eat a diet that includes fresh fruits and vegetables, whole grains, lean protein, and low-fat dairy products.  Take vitamin and mineral supplements as recommended by your health care provider.  Do not drink alcohol if: ? Your health care provider tells you not to drink. ? You are pregnant, may be pregnant, or are planning to become pregnant.  If you drink alcohol: ? Limit how much you have to 0-1 drink a day. ? Be aware of how much alcohol is in your drink. In the [place] one drink equals one 12 oz bottle of beer (355 mL), one 5 oz glass of wine (148 mL), or one 1 oz glass of hard liquor (44 mL).   Lifestyle  Take daily care of your teeth and gums. Brush your teeth every morning and night with fluoride toothpaste. Floss one time each day.  Stay active. Exercise for at least 30 minutes 5 or more days each week.  Do not use any products that contain nicotine or tobacco, such as cigarettes, e-cigarettes, and chewing tobacco. If you need help quitting, ask your health care provider.  Do not use drugs.  If you are sexually active, practice safe sex. Use a condom or other form of protection to prevent STIs (sexually transmitted infections).  If you do not wish to become pregnant, use a form of birth control. If you plan to become pregnant, see your health care provider for a prepregnancy visit.  If told by your health care provider, take low-dose aspirin daily starting at age 50.  Find healthy ways to cope with stress,  such as: ? Meditation, yoga, or listening to music. ? Journaling. ? Talking to a trusted person. ? Spending time with friends and family. Safety  Always wear your seat belt while driving or riding in a vehicle.  Do not drive: ? If you have been drinking alcohol. Do not ride with someone who has been drinking. ? When you  are tired or distracted. ? While texting.  Wear a helmet and other protective equipment during sports activities.  If you have firearms in your house, make sure you follow all gun safety procedures. What's next?  Visit your health care provider once a year for an annual wellness visit.  Ask your health care provider how often you should have your eyes and teeth checked.  Stay up to date on all vaccines. This information is not intended to replace advice given to you by your health care provider. Make sure you discuss any questions you have with your health care provider. Document Revised: 01/12/2020 Document Reviewed: 12/19/2017 Elsevier Patient Education  2021 [employer].

## 2020-06-03 LAB — CYTOLOGY - PAP
Adequacy: ABSENT
Comment: NEGATIVE
Diagnosis: UNDETERMINED — AB
High risk HPV: NEGATIVE

## 2020-06-23 ENCOUNTER — Ambulatory Visit: Payer: No Typology Code available for payment source | Admitting: Family Medicine

## 2020-06-30 ENCOUNTER — Encounter: Payer: Self-pay | Admitting: Obstetrics and Gynecology

## 2020-06-30 ENCOUNTER — Ambulatory Visit: Payer: No Typology Code available for payment source | Admitting: Obstetrics and Gynecology

## 2020-06-30 ENCOUNTER — Other Ambulatory Visit: Payer: Self-pay

## 2020-06-30 ENCOUNTER — Ambulatory Visit (INDEPENDENT_AMBULATORY_CARE_PROVIDER_SITE_OTHER): Payer: No Typology Code available for payment source

## 2020-06-30 VITALS — BP 118/70 | HR 81 | Ht 62.0 in | Wt 154.0 lb

## 2020-06-30 DIAGNOSIS — N920 Excessive and frequent menstruation with regular cycle: Secondary | ICD-10-CM

## 2020-06-30 DIAGNOSIS — R9389 Abnormal findings on diagnostic imaging of other specified body structures: Secondary | ICD-10-CM | POA: Diagnosis not present

## 2020-06-30 DIAGNOSIS — D219 Benign neoplasm of connective and other soft tissue, unspecified: Secondary | ICD-10-CM

## 2020-06-30 NOTE — Patient Instructions (Signed)
Endometrial Biopsy  An endometrial biopsy is a procedure to remove tissue samples from the endometrium, which is the lining of the uterus. The tissue that is removed can then be checked under a microscope for disease. This procedure is used to diagnose conditions such as endometrial cancer, endometrial tuberculosis, polyps, or other inflammatory conditions. This procedure may also be used to investigate uterine bleeding to determine where you are in your menstrual cycle or how your hormone levels are affecting the lining of the uterus. Tell a health care provider about:  Any allergies you have.  All medicines you are taking, including vitamins, herbs, eye drops, creams, and over-the-counter medicines.  Any problems you or family members have had with anesthetic medicines.  Any blood disorders you have.  Any surgeries you have had.  Any medical conditions you have.  Whether you are pregnant or may be pregnant. What are the risks? Generally, this is a safe procedure. However, problems may occur, including:  Bleeding.  Pelvic infection.  Puncture of the wall of the uterus with the biopsy device (rare).  Allergic reactions to medicines. What happens before the procedure?  Keep a record of your menstrual cycles as told by your health care provider. You may need to schedule your procedure for a specific time in your cycle.  You may want to bring a sanitary pad to wear after the procedure.  Plan to have someone take you home from the hospital or clinic.  Ask your health care provider about: ? Changing or stopping your regular medicines. This is especially important if you are taking diabetes medicines, arthritis medicines, or blood thinners. ? Taking medicines such as aspirin and ibuprofen. These medicines can thin your blood. Do not take these medicines unless your health care provider tells you to take them. ? Taking over-the-counter medicines, vitamins, herbs, and  supplements. What happens during the procedure?  You will lie on an exam table with your feet and legs supported as in a pelvic exam.  Your health care provider will insert an instrument (speculum) into your vagina to see your cervix.  Your cervix will be cleansed with an antiseptic solution.  A medicine (local anesthetic) will be used to numb the cervix.  A forceps instrument (tenaculum) will be used to hold your cervix steady for the biopsy.  A thin, rod-like instrument (uterine sound) will be inserted through your cervix to determine the length of your uterus and the location where the biopsy sample will be removed.  A thin, flexible tube (catheter) will be inserted through your cervix and into the uterus. The catheter will be used to collect the biopsy sample from your endometrial tissue.  The catheter and speculum will then be removed, and the tissue sample will be sent to a lab for examination. The procedure may vary among health care providers and hospitals. What can I expect after procedure?  You will rest in a recovery area until you are ready to go home.  You may have mild cramping and a small amount of vaginal bleeding. This is normal.  You may have a small amount of vaginal bleeding for a few days. This is normal.  It is up to you to get the results of your procedure. Ask your health care provider, or the department that is doing the procedure, when your results will be ready. Follow these instructions at home:  Take over-the-counter and prescription medicines only as told by your health care provider.  Do not douche, use tampons, or have   sexual intercourse until your health care provider approves.  Return to your normal activities as told by your health care provider. Ask your health care provider what activities are safe for you.  Follow instructions from your health care provider about any activity restrictions, such as restrictions on strenuous exercise or heavy  lifting.  Keep all follow-up visits. This is important. Contact a health care provider:  You have heavy bleeding, or bleed for longer than 2 days after the procedure.  You have bad smelling discharge from your vagina.  You have a fever or chills.  You have a burning sensation when urinating or you have difficulty urinating.  You have severe pain in your lower abdomen. Get help right away if you:  You have severe cramps in your stomach or back.  You pass large blood clots.  Your bleeding increases.  You become weak or light-headed, or you faint or lose consciousness. Summary  An endometrial biopsy is a procedure to remove tissue samples is taken from the endometrium, which is the lining of the uterus.  The tissue sample that is removed will be checked under a microscope for disease.  This procedure is used to diagnose conditions such as endometrial cancer, endometrial tuberculosis, polyps, or other inflammatory conditions.  After the procedure, it is common to have mild cramping and a small amount of vaginal bleeding for a few days.  Do not douche, use tampons, or have sexual intercourse until your health care provider approves. Ask your health care provider which activities are safe for you. This information is not intended to replace advice given to you by your health care provider. Make sure you discuss any questions you have with your health care provider. Document Revised: 11/02/2019 Document Reviewed: 11/02/2019 Elsevier Patient Education  2021 [place]. [email or web address]"> Diagnostic ultrasound (5th ed., pp. 720-947). [place]: Elsevier."> Pfenninger and Fowler's procedures for primary care (4th ed., pp. 096-283). [place]: Elsevier.">  Sonohysterogram  A sonohysterogram is a procedure to examine the inside of the uterus. This exam uses sound waves that are sent to a computer to make images of the lining of the uterus  (endometrium). To get the best images, a salt-water solution is put into the uterus through the vagina. You may have this procedure if you have certain reproductive problems, such as abnormal bleeding, infertility, or miscarriage. This procedure can show what may be causing these problems. Possible causes of these problems include scarring or abnormal growths, such as fibroids, inside your uterus. It can also show if your uterus is an abnormal shape or if the lining of the uterus is too thin. Tell a health care provider about:  All medicines you are taking, including vitamins, herbs, eye drops, creams, and over-the-counter medicines.  Any allergies you have.  Any blood disorders you have.  Any surgeries you have had.  Any medical conditions you have.  Whether you are pregnant or may be pregnant.  The date of the first day of your last period.  Any signs of infection, such as fever, pain in your lower abdomen, or abnormal discharge from your vagina. The procedure will not be done if you have an infection. What are the risks? Generally, this is a safe procedure. However, problems may occur, including:  Abdominal pain or cramping.  Light bleeding (spotting).  Increased vaginal discharge.  Infection. What happens before the procedure?  Your health care provider may have you take an over-the-counter pain medicine.  You may be given medicine to stop any abnormal bleeding.  You may be given antibiotic medicine to help prevent infection.  You may be asked to take a pregnancy test. This is usually in the form of a urine test. The procedure will not be done if you are pregnant.  You may have a pelvic exam.  You will be asked to empty your bladder. What happens during the procedure?  You will lie down on the exam table with your feet in stirrups or with your knees bent and your feet flat on the table.  A slender, handheld device (transducer) will be lubricated and placed into your  vagina.  The transducer will be positioned to send sound waves to your uterus. The sound waves will be sent to a computer and turned into images, which your health care provider will see during the procedure.  The transducer will be removed from your vagina.  An instrument called a speculum will be inserted to widen the opening of your vagina.  A swab with germ-killing solution (antiseptic) will be used to clean the opening to your uterus (cervix).  A long, thin tube (catheter) will be placed through your cervix into your uterus, and the speculum will be removed.  The transducer will be placed back into your vagina to take more images.  Your uterus will be filled with a germ-free salt-water solution (sterile saline) through the catheter. You may feel some cramping.  A fluid that contains air bubbles may be sent through the catheter to make it easier to see the fallopian tubes.  The transducer and catheter will be removed. The procedure may vary among health care providers and hospitals. What can I expect after the procedure? After the procedure, it is common to have:  Light bleeding from your vagina (spotting).  Pain or cramping in your abdomen.  Watery discharge from your vagina. Follow these instructions at home:  Take over-the-counter and prescription medicines only as told by your health care provider.  If you were prescribed an antibiotic medicine, take it as told by your health care provider. Do not stop taking the antibiotic even if you start to feel better.  Wear a sanitary pad or a tampon if you have spotting.  Return to your normal activities as told by your health care provider. Ask your health care provider what activities are safe for you.  It is up to you to get the results of your procedure. Ask your health care provider, or the department that is doing the procedure, when your results will be ready.  Keep all follow-up visits as told by your health care  provider. This is important. Contact a health care provider if you have:  Chills or a fever.  Pain or cramping that does not go away even with medicine.  An increase in vaginal discharge.  Vaginal discharge that is yellow or green in color and has a bad smell.  Nausea. Get help right away if you have:  Severe pain in your abdomen.  Heavy bleeding from your vagina. Summary  A sonohysterogram is a procedure that creates images of the inside of the uterus.  You may have this procedure if you have certain reproductive problems, such as abnormal bleeding, infertility, or miscarriage.  You may need to have a pelvic exam and take a pregnancy test before this procedure. The procedure will not be done if you are pregnant or have an infection.  After the procedure, it is common to have cramping and spotting. This information is not intended to replace advice given to  you by your health care provider. Make sure you discuss any questions you have with your health care provider. Document Revised: 03/24/2019 Document Reviewed: 03/24/2019 Elsevier Patient Education  2021 [place]. Uterine Fibroids  Uterine fibroids, also called leiomyomas, are noncancerous (benign) tumors that can grow in the uterus. They can cause heavy menstrual bleeding and pain. Fibroids may also grow in the fallopian tubes, cervix, or tissues (ligaments) near the uterus. You may have one or many fibroids. Fibroids vary in size, weight, and where they grow in the uterus. Some can become quite large. Most fibroids do not require medical treatment. What are the causes? The cause of this condition is not known. What increases the risk? You are more likely to develop this condition if you:  Are in your 30s or 40s and have not gone through menopause.  Have a family history of this condition.  Are of African American descent.  Started your menstrual period at age 74 or younger.  Have never given birth.  Are  overweight or obese. What are the signs or symptoms? Many women do not have any symptoms. Symptoms of this condition may include:  Heavy menstrual bleeding.  Bleeding between menstrual periods.  Pain and pressure in the pelvic area, between your hip bones.  Pain during sex.  Bladder problems, such as needing to urinate right away or more often than usual.  Inability to have children (infertility).  Failure to carry pregnancy to term (miscarriage). How is this diagnosed? This condition may be diagnosed based on:  Your symptoms and medical history.  A physical exam.  A pelvic exam that includes feeling for any tumors.  Imaging tests, such as ultrasound or MRI. How is this treated? Treatment for this condition may include follow-up visits with your health care provider to monitor your fibroids for any changes. Other treatment may include:  Medicines, such as: ? Medicines to relieve pain, including aspirin and NSAIDs, such as ibuprofen or naproxen. ? Hormone therapy. Treatment may be given as a pill or an injection, or it may be inserted into the uterus using an intrauterine device (IUD).  Surgery that would do one of the following: ? Remove the fibroids (myomectomy). This may be recommended if fibroids affect your fertility and you want to become pregnant. ? Remove the uterus (hysterectomy). ? Block the blood supply to the fibroids (uterine artery embolization). This can cause them to shrink and die. Follow these instructions at home: Medicines  Take over-the-counter and prescription medicines only as told by your health care provider.  Ask your health care provider if you should take iron pills or eat more iron-rich foods, such as dark green, leafy vegetables. Heavy menstrual bleeding can cause low iron levels. Managing pain If directed, apply heat to your back or abdomen to reduce pain. Use the heat source that your health care provider recommends, such as a moist heat  pack or a heating pad. To apply heat:  Place a towel between your skin and the heat source.  Leave the heat on for 20-30 minutes.  Remove the heat if your skin turns bright red. This is especially important if you are unable to feel pain, heat, or cold. You may have a greater risk of getting burned.   General instructions  Pay close attention to your menstrual cycle. Tell your health care provider about any changes, such as: ? Heavier bleeding that requires you to change your pads or tampons more than usual. ? A change in the number of  days that your menstrual period lasts. ? A change in symptoms that come with your menstrual period, such as back pain or cramps in your abdomen.  Keep all follow-up visits. This is important, especially if your fibroids need to be monitored for any changes. Contact a health care provider if you:  Have pelvic pain, back pain, or cramps in your abdomen that do not get better with medicine or heat.  Develop new bleeding between menstrual periods.  Have increased bleeding during or between menstrual periods.  Feel more tired or weak than usual.  Feel light-headed. Get help right away if you:  Faint.  Have pelvic pain that suddenly gets worse.  Have severe vaginal bleeding that soaks a tampon or pad in 30 minutes or less. Summary  Uterine fibroids are noncancerous (benign) tumors that can develop in the uterus.  The exact cause of this condition is not known.  Most fibroids do not require medical treatment unless they affect your ability to have children (fertility).  Contact a health care provider if you have pelvic pain, back pain, or cramps in your abdomen that do not get better with medicines.  Get help right away if you faint, have pelvic pain that suddenly gets worse, or have severe vaginal bleeding. This information is not intended to replace advice given to you by your health care provider. Make sure you discuss any questions you have  with your health care provider. Document Revised: 11/10/2019 Document Reviewed: 11/10/2019 Elsevier Patient Education  [place].

## 2020-06-30 NOTE — Progress Notes (Signed)
GYNECOLOGY  VISIT   HPI: 50 y.o.   Married  [place] American  female   [phone] with Patient's last menstrual period was 06/17/2020.   here to discuss ultrasound for heavy menses and an irregularly shaped uterus noted on pelvic examination.   Menses regular every 28 days and lasted 7 days.  Periods becoming heavier in the last 2 years.  No intermenstrual bleeding.   Started Micronor at her recent visit.  Has 3 pills left but cycle has lasted 10 days instead of 7.  Flow is only slight better.  Changes a super pad every 2 hours.  At night uses a thick and longer pad.   No cramping this menses.  December and January had increased cramping.   Hx ovarian cysts and not fibroids.  No FH of uterine or ovarian cancer.   GYNECOLOGIC HISTORY: Patient's last menstrual period was 06/17/2020. Contraception:  Micronor Menopausal hormone therapy:  none Last mammogram:  6 years ago -- patient to schedule Last pap smear:   06/02/20 ASCUS:Neg HR HPV        OB History    Gravida  5   Para      Term      Preterm      AB  2   Living  3     SAB      IAB  2   Ectopic      Multiple      Live Births  3              Patient Active Problem List   Diagnosis Date Noted  . Prediabetes 05/23/2020  . Iron deficiency 05/23/2020  . Thrombocytosis 05/23/2020  . Elevated LDL cholesterol level 05/23/2020  . Menorrhagia with regular cycle 05/20/2020  . History of COVID-19 05/20/2020  . Need for hepatitis C screening test 05/20/2020  . Family history of diabetes mellitus (DM) 05/20/2020  . Primary hypertension 05/20/2020  . Anemia 05/20/2020    Past Medical History:  Diagnosis Date  . Abnormal Pap smear of cervix   . Anemia   . Anxiety   . Blood transfusion without reported diagnosis   . Elevated LDL cholesterol level 05/23/2020  . Hypertension   . Iron deficiency 05/23/2020  . Migraines   . Ovarian cyst   . Prediabetes 05/23/2020  . Substance abuse ([place])    trichomonas many  yrs ago  . Thrombocytosis 05/23/2020    Past Surgical History:  Procedure Laterality Date  . CERVICAL BIOPSY  W/ LOOP ELECTRODE EXCISION     2016  . CHOLECYSTECTOMY    . DEEP NECK LYMPH NODE BIOPSY / EXCISION    . TUBAL LIGATION      Current Outpatient Medications  Medication Sig Dispense Refill  . amLODipine (NORVASC) 10 MG tablet Take 1 tablet (10 mg total) by mouth daily. 90 tablet 1  . Ferrous Sulfate (IRON PO) Take by mouth.    . norethindrone (MICRONOR) 0.35 MG tablet Take 1 tablet (0.35 mg total) by mouth daily. 28 tablet 12   No current facility-administered medications for this visit.     ALLERGIES: Patient has no known allergies.  Family History  Problem Relation Age of Onset  . Hypertension Mother   . Colon cancer Mother 33  . Heart disease Mother   . Hypertension Father   . Diabetes Sister   . Hypertension Sister   . Diabetes Brother   . Heart attack Brother   . Stroke Brother   . Diabetes Sister   .  Diabetes Brother   . Heart disease Maternal Grandmother     Social History   Socioeconomic History  . Marital status: Married    Spouse name: Not on file  . Number of children: Not on file  . Years of education: Not on file  . Highest education level: Not on file  Occupational History  . Not on file  Tobacco Use  . Smoking status: Never Smoker  . Smokeless tobacco: Never Used  Substance and Sexual Activity  . Alcohol use: Not Currently  . Drug use: Never  . Sexual activity: Yes    Partners: Male    Birth control/protection: Surgical    Comment: BTL  Other Topics Concern  . Not on file  Social History Narrative  . Not on file   Social Determinants of Health   Financial Resource Strain: Not on file  Food Insecurity: Not on file  Transportation Needs: Not on file  Physical Activity: Not on file  Stress: Not on file  Social Connections: Not on file  Intimate Partner Violence: Not on file    Review of Systems  Constitutional: Negative.    HENT: Negative.   Eyes: Negative.   Respiratory: Negative.   Cardiovascular: Negative.   Gastrointestinal: Negative.   Endocrine: Negative.   Genitourinary: Negative.   Musculoskeletal: Negative.   Skin: Negative.   Allergic/Immunologic: Negative.   Neurological: Negative.   Hematological: Negative.   Psychiatric/Behavioral: Negative.     PHYSICAL EXAMINATION:    BP 118/70   Pulse 81   Ht 5\' 2"  (1.575 m)   Wt 154 lb (69.9 kg)   LMP 06/17/2020   SpO2 99%   BMI 28.17 kg/m     General appearance: alert, cooperative and appears stated age  Pelvic US Uterus normal shape and size.  Fundal intramural fibroid 10 mm encroaching on endometrium.  EMS 14.76mmat fundus  Right CL cyst, collapsed, 13 mm.  Left ovary normal.  No adnexal masses.  No free fluid.  ASSESSMENT  Menorrhagia with regular menses.  Endometrial thickening for cycle time.  Fibroid, possible submucosal component.   PLAN  [place] images and report reviewed.  I discussed fibroids - signs, symptoms, benign nature, treatment options.  Return for sonohysterogram and EMB.  Procedures and rational explained.  If has significant intracavitary component to fibroid, hysteroscopic resection may be offered for treatment. Continue Micronor for now.  Has refills.    28 min  total time was spent for this patient encounter, including preparation, face-to-face counseling with the patient, coordination of care, and documentation of the encounter.

## 2020-08-01 ENCOUNTER — Encounter: Payer: Self-pay | Admitting: Internal Medicine

## 2020-08-04 ENCOUNTER — Ambulatory Visit (INDEPENDENT_AMBULATORY_CARE_PROVIDER_SITE_OTHER): Payer: No Typology Code available for payment source | Admitting: Obstetrics and Gynecology

## 2020-08-04 ENCOUNTER — Encounter: Payer: Self-pay | Admitting: Obstetrics and Gynecology

## 2020-08-04 ENCOUNTER — Other Ambulatory Visit: Payer: Self-pay

## 2020-08-04 ENCOUNTER — Ambulatory Visit (INDEPENDENT_AMBULATORY_CARE_PROVIDER_SITE_OTHER): Payer: No Typology Code available for payment source

## 2020-08-04 ENCOUNTER — Other Ambulatory Visit (HOSPITAL_COMMUNITY)
Admission: RE | Admit: 2020-08-04 | Discharge: 2020-08-04 | Disposition: A | Discharge status: Home / Self Care | Payer: No Typology Code available for payment source | Source: Ambulatory Visit | Attending: Obstetrics and Gynecology | Admitting: Obstetrics and Gynecology

## 2020-08-04 VITALS — BP 110/74 | HR 77 | Ht 62.0 in | Wt 154.0 lb

## 2020-08-04 DIAGNOSIS — D219 Benign neoplasm of connective and other soft tissue, unspecified: Secondary | ICD-10-CM

## 2020-08-04 DIAGNOSIS — N920 Excessive and frequent menstruation with regular cycle: Secondary | ICD-10-CM | POA: Insufficient documentation

## 2020-08-04 DIAGNOSIS — R9389 Abnormal findings on diagnostic imaging of other specified body structures: Secondary | ICD-10-CM

## 2020-08-04 DIAGNOSIS — N921 Excessive and frequent menstruation with irregular cycle: Secondary | ICD-10-CM | POA: Insufficient documentation

## 2020-08-04 NOTE — Patient Instructions (Signed)
Endometrial Biopsy  An endometrial biopsy is a procedure to remove tissue samples from the endometrium, which is the lining of the uterus. The tissue that is removed can then be checked under a microscope for disease. This procedure is used to diagnose conditions such as endometrial cancer, endometrial tuberculosis, polyps, or other inflammatory conditions. This procedure may also be used to investigate uterine bleeding to determine where you are in your menstrual cycle or how your hormone levels are affecting the lining of the uterus. Tell a health care provider about:  Any allergies you have.  All medicines you are taking, including vitamins, herbs, eye drops, creams, and over-the-counter medicines.  Any problems you or family members have had with anesthetic medicines.  Any blood disorders you have.  Any surgeries you have had.  Any medical conditions you have.  Whether you are pregnant or may be pregnant. What are the risks? Generally, this is a safe procedure. However, problems may occur, including:  Bleeding.  Pelvic infection.  Puncture of the wall of the uterus with the biopsy device (rare).  Allergic reactions to medicines. What happens before the procedure?  Keep a record of your menstrual cycles as told by your health care provider. You may need to schedule your procedure for a specific time in your cycle.  You may want to bring a sanitary pad to wear after the procedure.  Plan to have someone take you home from the hospital or clinic.  Ask your health care provider about: ? Changing or stopping your regular medicines. This is especially important if you are taking diabetes medicines, arthritis medicines, or blood thinners. ? Taking medicines such as aspirin and ibuprofen. These medicines can thin your blood. Do not take these medicines unless your health care provider tells you to take them. ? Taking over-the-counter medicines, vitamins, herbs, and  supplements. What happens during the procedure?  You will lie on an exam table with your feet and legs supported as in a pelvic exam.  Your health care provider will insert an instrument (speculum) into your vagina to see your cervix.  Your cervix will be cleansed with an antiseptic solution.  A medicine (local anesthetic) will be used to numb the cervix.  A forceps instrument (tenaculum) will be used to hold your cervix steady for the biopsy.  A thin, rod-like instrument (uterine sound) will be inserted through your cervix to determine the length of your uterus and the location where the biopsy sample will be removed.  A thin, flexible tube (catheter) will be inserted through your cervix and into the uterus. The catheter will be used to collect the biopsy sample from your endometrial tissue.  The catheter and speculum will then be removed, and the tissue sample will be sent to a lab for examination. The procedure may vary among health care providers and hospitals. What can I expect after procedure?  You will rest in a recovery area until you are ready to go home.  You may have mild cramping and a small amount of vaginal bleeding. This is normal.  You may have a small amount of vaginal bleeding for a few days. This is normal.  It is up to you to get the results of your procedure. Ask your health care provider, or the department that is doing the procedure, when your results will be ready. Follow these instructions at home:  Take over-the-counter and prescription medicines only as told by your health care provider.  Do not douche, use tampons, or have   sexual intercourse until your health care provider approves.  Return to your normal activities as told by your health care provider. Ask your health care provider what activities are safe for you.  Follow instructions from your health care provider about any activity restrictions, such as restrictions on strenuous exercise or heavy  lifting.  Keep all follow-up visits. This is important. Contact a health care provider:  You have heavy bleeding, or bleed for longer than 2 days after the procedure.  You have bad smelling discharge from your vagina.  You have a fever or chills.  You have a burning sensation when urinating or you have difficulty urinating.  You have severe pain in your lower abdomen. Get help right away if you:  You have severe cramps in your stomach or back.  You pass large blood clots.  Your bleeding increases.  You become weak or light-headed, or you faint or lose consciousness. Summary  An endometrial biopsy is a procedure to remove tissue samples is taken from the endometrium, which is the lining of the uterus.  The tissue sample that is removed will be checked under a microscope for disease.  This procedure is used to diagnose conditions such as endometrial cancer, endometrial tuberculosis, polyps, or other inflammatory conditions.  After the procedure, it is common to have mild cramping and a small amount of vaginal bleeding for a few days.  Do not douche, use tampons, or have sexual intercourse until your health care provider approves. Ask your health care provider which activities are safe for you. This information is not intended to replace advice given to you by your health care provider. Make sure you discuss any questions you have with your health care provider. Document Revised: 11/02/2019 Document Reviewed: 11/02/2019 Elsevier Patient Education  2021 [employer]  

## 2020-08-04 NOTE — Progress Notes (Signed)
GYNECOLOGY  VISIT   HPI: 50 y.o.   Married  [place] American  female   [phone] with Patient's last menstrual period was 07/13/2020.   here for sonohysterogram and EMB.   Patient has heavy menses and prolonged bleeding.  Cycles occur every 28 days, and last 7 - 10 days.  Pad change every 2 hours.   Ultrasound on 06/30/20 showed EMS 14.67 mm and 10 mm fibroid encroaching on the endometrium.   She is currently on Micronor for cycle control and has had a tubal ligation. Her second month, she had improvement with less bleeding but just a longer cycle.  States she had a hard time waking up from anesthesia when she had surgery on her neck.  She is followed for anemia through her PCP.   GYNECOLOGIC HISTORY: Patient's last menstrual period was 07/13/2020. Contraception:  Micronor, tubal ligation Menopausal hormone therapy:  none Last mammogram: 6 years ago--normal Last pap smear: 06-02-20 ASCUS:06/02/20 ASCUS:Neg HR HPV        OB History    Gravida  5   Para      Term      Preterm      AB  2   Living  3     SAB      IAB  2   Ectopic      Multiple      Live Births  3              Patient Active Problem List   Diagnosis Date Noted  . Prediabetes 05/23/2020  . Iron deficiency 05/23/2020  . Thrombocytosis 05/23/2020  . Elevated LDL cholesterol level 05/23/2020  . Menorrhagia with regular cycle 05/20/2020  . History of COVID-19 05/20/2020  . Need for hepatitis C screening test 05/20/2020  . Family history of diabetes mellitus (DM) 05/20/2020  . Primary hypertension 05/20/2020  . Anemia 05/20/2020    Past Medical History:  Diagnosis Date  . Abnormal Pap smear of cervix   . Anemia   . Anxiety   . Blood transfusion without reported diagnosis   . Elevated LDL cholesterol level 05/23/2020  . Hypertension   . Iron deficiency 05/23/2020  . Migraines   . Ovarian cyst   . Prediabetes 05/23/2020  . Substance abuse ([place])    trichomonas many yrs ago  .  Thrombocytosis 05/23/2020    Past Surgical History:  Procedure Laterality Date  . CERVICAL BIOPSY  W/ LOOP ELECTRODE EXCISION     2016  . CHOLECYSTECTOMY    . DEEP NECK LYMPH NODE BIOPSY / EXCISION    . TUBAL LIGATION      Current Outpatient Medications  Medication Sig Dispense Refill  . amLODipine (NORVASC) 10 MG tablet TAKE 1 TABLET (10 MG TOTAL) BY MOUTH DAILY. 90 tablet 1  . Ferrous Sulfate (IRON PO) Take by mouth.    . norethindrone (MICRONOR) 0.35 MG tablet TAKE 1 TABLET (0.35 MG TOTAL) BY MOUTH DAILY. 28 tablet 12   No current facility-administered medications for this visit.     ALLERGIES: Patient has no known allergies.  Family History  Problem Relation Age of Onset  . Hypertension Mother   . Colon cancer Mother 14  . Heart disease Mother   . Hypertension Father   . Diabetes Sister   . Hypertension Sister   . Diabetes Brother   . Heart attack Brother   . Stroke Brother   . Diabetes Sister   . Diabetes Brother   . Heart disease Maternal Grandmother  Social History   Socioeconomic History  . Marital status: Married    Spouse name: Not on file  . Number of children: Not on file  . Years of education: Not on file  . Highest education level: Not on file  Occupational History  . Not on file  Tobacco Use  . Smoking status: Never Smoker  . Smokeless tobacco: Never Used  Substance and Sexual Activity  . Alcohol use: Not Currently  . Drug use: Never  . Sexual activity: Yes    Partners: Male    Birth control/protection: Surgical    Comment: BTL  Other Topics Concern  . Not on file  Social History Narrative  . Not on file   Social Determinants of Health   Financial Resource Strain: Not on file  Food Insecurity: Not on file  Transportation Needs: Not on file  Physical Activity: Not on file  Stress: Not on file  Social Connections: Not on file  Intimate Partner Violence: Not on file    Review of Systems  See HPI.  PHYSICAL EXAMINATION:    BP  110/74   Pulse 77   Ht 5\' 2"  (1.575 m)   Wt 154 lb (69.9 kg)   LMP 07/13/2020   SpO2 100%   BMI 28.17 kg/m     General appearance: alert, cooperative and appears stated age   Pelvic US Uterus with 2 fibroids, 1.08 cm and 1.13 xm.  Fundal fibroid encroaching on the endometrium.  Left ovary with collapsed CL cyst.  Sonohysterogram Consent for procedure Sterile prep with Hibiclens.  Cannula placed inside uterine cavity without difficulty.  NS injected.  2 filling defects noted and small fibroid encroaching minimally in cavity appreciated. No complications.  Minimal EBL.   EMB Consent for procedure. Sterile prep with Hibiclens. Pipelle passed to 8 cm x 3.   No feeder vessel commented on.  Tissue to pathology.  No complications.  Minimal EBL.  Chaperone was present for exam.  ASSESSMENT  Menorrhagia with regular menses.  Prolonged menstruation.  Small fibroid.  Thickened endometrium with 2 possible polyps.  On Micronor.  Status post BTL.  Thrombocytosis.  Difficulty waking up from anesthesia with one of her surgeries.   PLAN  Findings reviewed.  (2 fibroids noted and not just one after the patient left office today.) Will await final biopsy results.  If polyp present, proceed with hysteroscopy and dilation and curettage.  If no polyp confirmed, OK to continue Micronor or consider Mirena IUD. Patient would like to avoid endometrial ablation or prolonged surgical procedures.   30 min  total time was spent for this patient encounter, including preparation, face-to-face counseling with the patient, coordination of care, and documentation of the encounter.

## 2020-08-05 LAB — SURGICAL PATHOLOGY

## 2020-08-11 ENCOUNTER — Other Ambulatory Visit (HOSPITAL_COMMUNITY): Payer: Self-pay

## 2020-08-11 MED FILL — Norethindrone Tab 0.35 MG: ORAL | 28 days supply | Qty: 28 | Fill #0 | Status: AC

## 2020-09-12 ENCOUNTER — Other Ambulatory Visit (HOSPITAL_COMMUNITY): Payer: Self-pay

## 2020-09-12 MED FILL — Norethindrone Tab 0.35 MG: ORAL | 28 days supply | Qty: 28 | Fill #1 | Status: CN

## 2020-09-12 MED FILL — Amlodipine Besylate Tab 10 MG (Base Equivalent): ORAL | 90 days supply | Qty: 90 | Fill #0 | Status: AC

## 2020-09-13 ENCOUNTER — Other Ambulatory Visit (HOSPITAL_COMMUNITY): Payer: Self-pay

## 2020-09-13 MED FILL — Norethindrone Tab 0.35 MG: ORAL | 84 days supply | Qty: 84 | Fill #1 | Status: AC

## 2020-09-27 ENCOUNTER — Other Ambulatory Visit (HOSPITAL_COMMUNITY): Payer: Self-pay

## 2020-09-27 MED ORDER — CARESTART COVID-19 HOME TEST VI KIT
PACK | 0 refills | Status: DC
  Filled 2020-09-27: qty 4, 4d supply, fill #0

## 2020-10-19 ENCOUNTER — Encounter: Payer: Self-pay | Admitting: Internal Medicine

## 2020-10-26 ENCOUNTER — Ambulatory Visit
Admission: RE | Admit: 2020-10-26 | Discharge: 2020-10-26 | Disposition: A | Discharge status: Home / Self Care | Payer: No Typology Code available for payment source | Source: Ambulatory Visit | Attending: Family Medicine | Admitting: Family Medicine

## 2020-10-26 ENCOUNTER — Other Ambulatory Visit: Payer: Self-pay

## 2020-10-26 DIAGNOSIS — Z1231 Encounter for screening mammogram for malignant neoplasm of breast: Secondary | ICD-10-CM

## 2020-11-03 ENCOUNTER — Ambulatory Visit: Payer: No Typology Code available for payment source | Admitting: Obstetrics and Gynecology

## 2020-11-10 ENCOUNTER — Encounter: Payer: No Typology Code available for payment source | Admitting: Family Medicine

## 2020-12-05 ENCOUNTER — Other Ambulatory Visit (HOSPITAL_COMMUNITY): Payer: Self-pay

## 2020-12-05 ENCOUNTER — Other Ambulatory Visit: Payer: Self-pay | Admitting: Family Medicine

## 2020-12-05 DIAGNOSIS — I10 Essential (primary) hypertension: Secondary | ICD-10-CM

## 2020-12-05 MED ORDER — AMLODIPINE BESYLATE 10 MG PO TABS
10.0000 mg | ORAL_TABLET | Freq: Every day | ORAL | 0 refills | Status: DC
  Filled 2020-12-05: qty 90, 90d supply, fill #0

## 2020-12-05 MED FILL — Norethindrone Tab 0.35 MG: ORAL | 84 days supply | Qty: 84 | Fill #2 | Status: AC

## 2021-05-01 ENCOUNTER — Other Ambulatory Visit: Payer: Self-pay | Admitting: Family Medicine

## 2021-05-01 ENCOUNTER — Other Ambulatory Visit (HOSPITAL_COMMUNITY): Payer: Self-pay

## 2021-05-01 DIAGNOSIS — I10 Essential (primary) hypertension: Secondary | ICD-10-CM

## 2021-05-01 MED ORDER — AMLODIPINE BESYLATE 10 MG PO TABS
10.0000 mg | ORAL_TABLET | Freq: Every day | ORAL | 0 refills | Status: DC
  Filled 2021-05-01: qty 90, 90d supply, fill #0

## 2021-05-01 MED FILL — Norethindrone Tab 0.35 MG: ORAL | 84 days supply | Qty: 84 | Fill #3 | Status: AC

## 2021-06-23 ENCOUNTER — Encounter: Payer: No Typology Code available for payment source | Admitting: Physician Assistant

## 2021-10-19 ENCOUNTER — Other Ambulatory Visit (HOSPITAL_COMMUNITY): Payer: Self-pay

## 2021-10-19 ENCOUNTER — Other Ambulatory Visit: Payer: Self-pay | Admitting: Family Medicine

## 2021-10-19 ENCOUNTER — Encounter: Payer: Self-pay | Admitting: Radiology

## 2021-10-19 ENCOUNTER — Ambulatory Visit (INDEPENDENT_AMBULATORY_CARE_PROVIDER_SITE_OTHER): Payer: No Typology Code available for payment source | Admitting: Radiology

## 2021-10-19 VITALS — BP 122/74

## 2021-10-19 DIAGNOSIS — N952 Postmenopausal atrophic vaginitis: Secondary | ICD-10-CM

## 2021-10-19 DIAGNOSIS — Z113 Encounter for screening for infections with a predominantly sexual mode of transmission: Secondary | ICD-10-CM | POA: Diagnosis not present

## 2021-10-19 DIAGNOSIS — I10 Essential (primary) hypertension: Secondary | ICD-10-CM

## 2021-10-19 DIAGNOSIS — N898 Other specified noninflammatory disorders of vagina: Secondary | ICD-10-CM

## 2021-10-19 LAB — WET PREP FOR TRICH, YEAST, CLUE

## 2021-10-19 MED ORDER — FLUCONAZOLE 150 MG PO TABS
150.0000 mg | ORAL_TABLET | ORAL | 0 refills | Status: DC
  Filled 2021-10-19: qty 2, 6d supply, fill #0

## 2021-10-19 MED ORDER — AMLODIPINE BESYLATE 10 MG PO TABS
10.0000 mg | ORAL_TABLET | Freq: Every day | ORAL | 0 refills | Status: DC
  Filled 2021-10-19: qty 30, 30d supply, fill #0

## 2021-10-19 MED ORDER — METRONIDAZOLE 0.75 % VA GEL
1.0000 | Freq: Every day | VAGINAL | 0 refills | Status: AC
  Filled 2021-10-19: qty 70, 5d supply, fill #0

## 2021-10-19 MED ORDER — ESTRADIOL 10 MCG VA TABS
1.0000 | ORAL_TABLET | VAGINAL | 3 refills | Status: DC
  Filled 2021-10-19: qty 24, 84d supply, fill #0
  Filled 2022-01-06 – 2022-01-19 (×2): qty 24, 84d supply, fill #1
  Filled 2022-04-11 – 2022-05-08 (×2): qty 24, 84d supply, fill #2
  Filled 2022-08-22: qty 24, 84d supply, fill #3

## 2021-10-19 NOTE — Progress Notes (Signed)
      Subjective: Pamela Todd is a 51 y.o. female who complains of vaginal discharge, itching, no odor x 2 weeks. Used Monistat 7 with no relief. Had a new partner before this started, would like STI screen. She is also struggling with vaginal dryness regularly now.   Review of Systems  All other systems reviewed and are negative.    Objective:  -Vulva: without lesions or discharge -Vagina: mild atrophy, discharge present, aptima swab and wet prep obtained -Cervix: no lesion or discharge, no CMT -Perineum: no lesions -Uterus: Mobile, non tender -Adnexa: no masses or tenderness   Microscopic wet-mount exam shows clue cells, hyphae.   Chaperone offered and declined.  Assessment:/Plan:  1. Vaginal discharge  - WET PREP FOR TRICH, YEAST, CLUE - SURESWAB CT/NG/T. vaginalis  2. Atrophic vaginitis  - Estradiol (YUVAFEM) 10 MCG TABS vaginal tablet; Place 1 tablet (10 mcg total) vaginally 2 (two) times a week.  Dispense: 24 tablet; Refill: 3   Will contact patient with results of testing completed today. Avoid intercourse until symptoms are resolved. Safe sex encouraged. Avoid the use of soaps or perfumed products in the peri area. Avoid tub baths and sitting in sweaty or wet clothing for prolonged periods of time.

## 2021-10-20 ENCOUNTER — Encounter: Payer: Self-pay | Admitting: Internal Medicine

## 2021-10-20 LAB — SURESWAB CT/NG/T. VAGINALIS
C. trachomatis RNA, TMA: NOT DETECTED
N. gonorrhoeae RNA, TMA: NOT DETECTED
Trichomonas vaginalis RNA: NOT DETECTED

## 2021-12-20 ENCOUNTER — Other Ambulatory Visit (HOSPITAL_COMMUNITY): Payer: Self-pay

## 2021-12-20 ENCOUNTER — Other Ambulatory Visit: Payer: Self-pay

## 2021-12-20 ENCOUNTER — Telehealth: Payer: Self-pay | Admitting: Physician Assistant

## 2021-12-20 DIAGNOSIS — I10 Essential (primary) hypertension: Secondary | ICD-10-CM

## 2021-12-20 MED ORDER — AMLODIPINE BESYLATE 10 MG PO TABS
10.0000 mg | ORAL_TABLET | Freq: Every day | ORAL | 1 refills | Status: DC
  Filled 2021-12-20: qty 30, 30d supply, fill #0

## 2021-12-20 NOTE — Telephone Encounter (Signed)
Pt needs refill on amlodipine She has just a couple pills left, she is scheduled for medcheck with [facility] end of September

## 2021-12-27 ENCOUNTER — Encounter: Payer: Self-pay | Admitting: Internal Medicine

## 2022-01-08 ENCOUNTER — Other Ambulatory Visit (HOSPITAL_COMMUNITY): Payer: Self-pay

## 2022-01-10 ENCOUNTER — Encounter: Payer: No Typology Code available for payment source | Admitting: Medical

## 2022-01-16 ENCOUNTER — Other Ambulatory Visit (HOSPITAL_COMMUNITY): Payer: Self-pay

## 2022-01-19 ENCOUNTER — Other Ambulatory Visit (HOSPITAL_COMMUNITY): Payer: Self-pay

## 2022-01-22 ENCOUNTER — Ambulatory Visit (INDEPENDENT_AMBULATORY_CARE_PROVIDER_SITE_OTHER): Payer: No Typology Code available for payment source | Admitting: Medical

## 2022-01-22 ENCOUNTER — Encounter: Payer: Self-pay | Admitting: Medical

## 2022-01-22 VITALS — BP 120/80 | HR 82 | Wt 164.0 lb

## 2022-01-22 DIAGNOSIS — Z13220 Encounter for screening for lipoid disorders: Secondary | ICD-10-CM

## 2022-01-22 DIAGNOSIS — Z0000 Encounter for general adult medical examination without abnormal findings: Secondary | ICD-10-CM

## 2022-01-22 DIAGNOSIS — D649 Anemia, unspecified: Secondary | ICD-10-CM

## 2022-01-22 DIAGNOSIS — Z800 Family history of malignant neoplasm of digestive organs: Secondary | ICD-10-CM

## 2022-01-22 DIAGNOSIS — E7800 Pure hypercholesterolemia, unspecified: Secondary | ICD-10-CM

## 2022-01-22 DIAGNOSIS — I10 Essential (primary) hypertension: Secondary | ICD-10-CM

## 2022-01-22 DIAGNOSIS — Z833 Family history of diabetes mellitus: Secondary | ICD-10-CM

## 2022-01-22 DIAGNOSIS — E611 Iron deficiency: Secondary | ICD-10-CM

## 2022-01-22 DIAGNOSIS — R8290 Unspecified abnormal findings in urine: Secondary | ICD-10-CM

## 2022-01-22 DIAGNOSIS — Z131 Encounter for screening for diabetes mellitus: Secondary | ICD-10-CM | POA: Diagnosis not present

## 2022-01-22 DIAGNOSIS — M25511 Pain in right shoulder: Secondary | ICD-10-CM

## 2022-01-22 DIAGNOSIS — Z113 Encounter for screening for infections with a predominantly sexual mode of transmission: Secondary | ICD-10-CM

## 2022-01-22 DIAGNOSIS — R7303 Prediabetes: Secondary | ICD-10-CM

## 2022-01-22 DIAGNOSIS — Z1211 Encounter for screening for malignant neoplasm of colon: Secondary | ICD-10-CM

## 2022-01-22 LAB — POCT URINALYSIS DIP (PROADVANTAGE DEVICE)
Bilirubin, UA: NEGATIVE
Blood, UA: NEGATIVE
Glucose, UA: NEGATIVE mg/dL
Ketones, POC UA: NEGATIVE mg/dL
Nitrite, UA: POSITIVE — AB
Specific Gravity, Urine: 1.025
Urobilinogen, Ur: NEGATIVE
pH, UA: 6 (ref 5.0–8.0)

## 2022-01-22 NOTE — Addendum Note (Signed)
Addended by: Minette Headland A on: 01/22/2022 03:32 PM   Modules accepted: Orders

## 2022-01-22 NOTE — Progress Notes (Signed)
Subjective:  Pamela Todd is a 51 y.o. female who presents for Chief Complaint  Patient presents with   med check    Med check- fasting, shoulder pain x Wednesday. Will get flu shot at work   Annual Exam      Medical team: No local dentist or eye doctor Dr. Gala Romney, gynecology Primary care here.  Concerns: Hypertension - compliant with blood pressure pill daily.   Doesn't check BPs currently.  No chest pain, no dyspnea, no edema.   Has pain in right shoulder for about 5 days.  Thinks she sleep wrong.  No recent injury or fall.  Works as Biomedical engineer.  Not exercising in general.   No prior shoulder problems  Hx/o iron deficiency and anemia.   Taking iron OTC daily  Has been prescribed lipid medicaiton, but didn't take it.  Has seen cardiology maybe 7-8 test for workup including stress testing that was reportedly normal.  No other aggravating or relieving factors.    No other c/o.   Past Medical History:  Diagnosis Date   Abnormal Pap smear of cervix    Anemia    Anxiety    Blood transfusion without reported diagnosis    Elevated LDL cholesterol level 05/23/2020   Hypertension    Iron deficiency 05/23/2020   Migraines    Ovarian cyst    Prediabetes 05/23/2020   Thrombocytosis 05/23/2020    Family History  Problem Relation Age of Onset   Hypertension Mother    Colon cancer Mother 75   Heart disease Mother    Hypertension Father    Diabetes Sister    Hypertension Sister    Diabetes Brother    Heart attack Brother    Stroke Brother    Diabetes Sister    Diabetes Brother    Heart disease Maternal Grandmother      Current Outpatient Medications:    amLODipine (NORVASC) 10 MG tablet, Take 1 tablet (10 mg total) by mouth daily., Disp: 30 tablet, Rfl: 1   Estradiol (YUVAFEM) 10 MCG TABS vaginal tablet, Place 1 tablet (10 mcg total) vaginally 2 (two) times a week., Disp: 24 tablet, Rfl: 3  No Known Allergies   Reviewed their medical,  surgical, family, social, medication, and allergy history and updated chart as appropriate.   Review of Systems Constitutional: -fever, -chills, -sweats, -unexpected weight change, -decreased appetite, -fatigue Allergy: -sneezing, -itching, -congestion Dermatology: -changing moles, --rash, -lumps ENT: -runny nose, -ear pain, -sore throat, -hoarseness, -sinus pain, -teeth pain, - ringing in ears, -hearing loss, -nosebleeds Cardiology: -chest pain, -palpitations, -swelling, -difficulty breathing when lying flat, -waking up short of breath Respiratory: -cough, -shortness of breath, -difficulty breathing with exercise or exertion, -wheezing, -coughing up blood Gastroenterology: -abdominal pain, -nausea, -vomiting, -diarrhea, -constipation, -blood in stool, -changes in bowel movement, -difficulty swallowing or eating Hematology: -bleeding, -bruising  Musculoskeletal: +joint aches, -muscle aches, -joint swelling, -back pain, -neck pain, -cramping, -changes in gait Ophthalmology: denies vision changes, eye redness, itching, discharge Urology: -burning with urination, -difficulty urinating, -blood in urine, -urinary frequency, -urgency, -incontinence Neurology: -headache, -weakness, -tingling, -numbness, -memory loss, -falls, -dizziness Psychology: -depressed mood, -agitation, -sleep problems Breast/gyn: -breast tendnerss, -discharge, -lumps, -vaginal discharge,- irregular periods, -heavy periods      01/22/2022   10:14 AM  Depression screen PHQ 2/9  Decreased Interest 0  Down, Depressed, Hopeless 0  PHQ - 2 Score 0       Objective:  BP 120/80   Pulse 82   Wt 164 lb (  74.4 kg)   BMI 30.00 kg/m   General appearance: alert, no distress, WD/WN, African American female Skin: brown raised skin tags of bilat cheeks and lower orbits HEENT: normocephalic, conjunctiva/corneas normal, sclerae anicteric, PERRLA, EOMi, nares patent, no discharge or erythema, pharynx normal Oral cavity: MMM, tongue  normal, teeth normal Neck: supple, no lymphadenopathy, no thyromegaly, no masses, normal ROM, no bruits Chest: non tender, normal shape and expansion Heart: RRR, normal S1, S2, no murmurs Lungs: CTA bilaterally, no wheezes, rhonchi, or rales Abdomen: +bs, soft, non tender, non distended, no masses, no hepatomegaly, no splenomegaly, no bruits Back: non tender, normal ROM, no scoliosis Musculoskeletal: + Mild tenderness over right AC joint, range of motion slightly reduced internal and external range of motion of the right shoulder, mild pain with gentle range of motion but no obvious laxity, otherwise upper extremities non tender, no obvious deformity, normal ROM throughout, lower extremities non tender, no obvious deformity, normal ROM throughout Extremities: no edema, no cyanosis, no clubbing Pulses: 2+ symmetric, upper and lower extremities, normal cap refill Neurological: alert, oriented x 3, CN2-12 intact, strength normal upper extremities and lower extremities, sensation normal throughout, DTRs 2+ throughout, no cerebellar signs, gait normal Psychiatric: normal affect, behavior normal, pleasant  Breast/gyn/rectal - deferred to gynecology   EKG reviewed    Assessment and Plan :   Encounter Diagnoses  Name Primary?   Encounter for health maintenance examination in adult Yes   Screening for lipid disorders    Screening for diabetes mellitus    Primary hypertension    Prediabetes    Iron deficiency    Elevated LDL cholesterol level    Anemia, unspecified type    Family history of diabetes mellitus (DM)    Screen for STD (sexually transmitted disease)    Screen for colon cancer    Acute pain of right shoulder    Family history of colon cancer in mother      This visit was a preventative care visit, also known as wellness visit or routine physical.   Topics typically include healthy lifestyle, diet, exercise, preventative care, vaccinations, sick and well care, proper use of  emergency dept and after hours care, as well as other concerns.     Recommendations: Continue to return yearly for your annual wellness and preventative care visits.  This gives [place] a chance to discuss healthy lifestyle, exercise, vaccinations, review your chart record, and perform screenings where appropriate.  I recommend you see your eye doctor yearly for routine vision care.  I recommend you see your dentist yearly for routine dental care including hygiene visits twice yearly.   Vaccination recommendations were reviewed Immunization History  Administered Date(s) Administered   Influenza-Unspecified 01/05/2019, 02/17/2020   PFIZER(Purple Top)SARS-COV-2 Vaccination 08/14/2019, 09/11/2019   Tdap 01/05/2019   Shingles vaccine:  I recommend you have a shingles vaccine to help prevent shingles or herpes zoster outbreak.   Please call your insurer to inquire about coverage for the Shingrix vaccine given in 2 doses.   Some insurers cover this vaccine after age 67, some cover this after age 84.  If your insurer covers this, then call to schedule appointment to have this vaccine here.   Screening for cancer: Colon cancer screening: We will refer you for screening colonoscopy  Breast cancer screening: You should perform a self breast exam monthly.   We reviewed recommendations for regular mammograms and breast cancer screening.  Cervical cancer screening: We reviewed recommendations for pap smear screening.   Skin cancer  screening: Check your skin regularly for new changes, growing lesions, or other lesions of concern Come in for evaluation if you have skin lesions of concern.  Lung cancer screening: If you have a greater than 20 pack year history of tobacco use, then you may qualify for lung cancer screening with a chest CT scan.   Please call your insurance company to inquire about coverage for this test.  We currently don't have screenings for other cancers besides breast, cervical,  colon, and lung cancers.  If you have a strong family history of cancer or have other cancer screening concerns, please let me know.    Bone health: Get at least 150 minutes of aerobic exercise weekly Get weight bearing exercise at least once weekly Bone density test:  A bone density test is an imaging test that uses a type of X-ray to measure the amount of calcium and other minerals in your bones. The test may be used to diagnose or screen you for a condition that causes weak or thin bones (osteoporosis), predict your risk for a broken bone (fracture), or determine how well your osteoporosis treatment is working. The bone density test is recommended for females 21 and older, or females or males <41 if certain risk factors such as thyroid disease, long term use of steroids such as for asthma or rheumatological issues, vitamin D deficiency, estrogen deficiency, family history of osteoporosis, self or family history of fragility fracture in first degree relative.    Heart health: Get at least 150 minutes of aerobic exercise weekly Limit alcohol It is important to maintain a healthy blood pressure and healthy cholesterol numbers  Heart disease screening: Screening for heart disease includes screening for blood pressure, fasting lipids, glucose/diabetes screening, BMI height to weight ratio, reviewed of smoking status, physical activity, and diet.    Goals include blood pressure 120/80 or less, maintaining a healthy lipid/cholesterol profile, preventing diabetes or keeping diabetes numbers under good control, not smoking or using tobacco products, exercising most days per week or at least 150 minutes per week of exercise, and eating healthy variety of fruits and vegetables, healthy oils, and avoiding unhealthy food choices like fried food, fast food, high sugar and high cholesterol foods.    Other tests may possibly include EKG test, CT coronary calcium score, echocardiogram, exercise treadmill  stress test.     Medical care options: I recommend you continue to seek care here first for routine care.  We try really hard to have available appointments Monday through Friday daytime hours for sick visits, acute visits, and physicals.  Urgent care should be used for after hours and weekends for significant issues that cannot wait till the next day.  The emergency department should be used for significant potentially life-threatening emergencies.  The emergency department is expensive, can often have long wait times for less significant concerns, so try to utilize primary care, urgent care, or telemedicine when possible to avoid unnecessary trips to the emergency department.  Virtual visits and telemedicine have been introduced since the pandemic started in 2020, and can be convenient ways to receive medical care.  We offer virtual appointments as well to assist you in a variety of options to seek medical care.   Advanced Directives: I recommend you consider completing a [place] and Living Will.   These documents respect your wishes and help alleviate burdens on your loved ones if you were to become terminally ill or be in a position to need those  documents enforced.    You can complete Advanced Directives yourself, have them notarized, then have copies made for our office, for you and for anybody you feel should have them in safe keeping.  Or, you can have an attorney prepare these documents.   If you haven't updated your Last Will and Testament in a while, it may be worthwhile having an attorney prepare these documents together and save on some costs.       Separate significant issues discussed: Hypertension-continue current medication  History of iron deficiency anemia-updated labs today  Prediabetes, screen for diabetes-updated labs today  Family history of colon cancer-referral to colon cancer screening  Prior high cholesterol-pending labs we will likely  recommend statin    Estela was seen today for med check and annual exam.  Diagnoses and all orders for this visit:  Encounter for health maintenance examination in adult -     Comprehensive metabolic panel -     CBC with Differential/Platelet -     Iron, TIBC and Ferritin Panel -     Lipid panel -     Hemoglobin A1c -     POCT Urinalysis DIP (Proadvantage Device) -     RPR+HIV+GC+CT Panel -     Ambulatory referral to Gastroenterology -     EKG 12-Lead  Screening for lipid disorders -     Lipid panel  Screening for diabetes mellitus -     Hemoglobin A1c  Primary hypertension -     EKG 12-Lead  Prediabetes -     Hemoglobin A1c  Iron deficiency  Elevated LDL cholesterol level  Anemia, unspecified type -     CBC with Differential/Platelet -     Iron, TIBC and Ferritin Panel  Family history of diabetes mellitus (DM)  Screen for STD (sexually transmitted disease) -     RPR+HIV+GC+CT Panel  Screen for colon cancer -     Ambulatory referral to Gastroenterology  Acute pain of right shoulder  Family history of colon cancer in mother    Follow-up pending labs, yearly for physical

## 2022-01-22 NOTE — Patient Instructions (Addendum)
DENTIST RECOMMENDATION Dr. Jonna Coup, dentist [address] [place], [place] [address] [phone] [email or web address]   Optometrists nearby  [facility] [address] [place], [place] [address] [phone]  Dr. Blair Heys [address] [place], [place] [address] [phone]  [facility] [address] [place] [place], [place] [address] [phone]  [facility], Dr. Idolina Primer [address] [place], [place] [address] [phone]    This visit was a preventative care visit, also known as wellness visit or routine physical.   Topics typically include healthy lifestyle, diet, exercise, preventative care, vaccinations, sick and well care, proper use of emergency dept and after hours care, as well as other concerns.     Recommendations: Continue to return yearly for your annual wellness and preventative care visits.  This gives [place] a chance to discuss healthy lifestyle, exercise, vaccinations, review your chart record, and perform screenings where appropriate.  I recommend you see your eye doctor yearly for routine vision care.  I recommend you see your dentist yearly for routine dental care including hygiene visits twice yearly.   Vaccination recommendations were reviewed Immunization History  Administered Date(s) Administered   Influenza-Unspecified 01/05/2019, 02/17/2020   PFIZER(Purple Top)SARS-COV-2 Vaccination 08/14/2019, 09/11/2019   Tdap 01/05/2019   Shingles vaccine:  I recommend you have a shingles vaccine to help prevent shingles or herpes zoster outbreak.   Please call your insurer to inquire about coverage for the Shingrix vaccine given in 2 doses.   Some insurers cover this vaccine after age 59, some cover this after age 79.  If your insurer covers this, then call to schedule appointment to have this vaccine here.   Screening for cancer: Colon cancer screening: We will refer you for screening  colonoscopy  Breast cancer screening: You should perform a self breast exam monthly.   We reviewed recommendations for regular mammograms and breast cancer screening.  Cervical cancer screening: We reviewed recommendations for pap smear screening.   Skin cancer screening: Check your skin regularly for new changes, growing lesions, or other lesions of concern Come in for evaluation if you have skin lesions of concern.  Lung cancer screening: If you have a greater than 20 pack year history of tobacco use, then you may qualify for lung cancer screening with a chest CT scan.   Please call your insurance company to inquire about coverage for this test.  We currently don't have screenings for other cancers besides breast, cervical, colon, and lung cancers.  If you have a strong family history of cancer or have other cancer screening concerns, please let me know.    Bone health: Get at least 150 minutes of aerobic exercise weekly Get weight bearing exercise at least once weekly Bone density test:  A bone density test is an imaging test that uses a type of X-ray to measure the amount of calcium and other minerals in your bones. The test may be used to diagnose or screen you for a condition that causes weak or thin bones (osteoporosis), predict your risk for a broken bone (fracture), or determine how well your osteoporosis treatment is working. The bone density test is recommended for females 61 and older, or females or males <46 if certain risk factors such as thyroid disease, long term use of steroids such as for asthma or rheumatological issues, vitamin D deficiency, estrogen deficiency, family history of osteoporosis, self or family history of fragility fracture in first degree relative.    Heart health: Get  at least 150 minutes of aerobic exercise weekly Limit alcohol It is important to maintain a healthy blood pressure and healthy cholesterol numbers  Heart disease  screening: Screening for heart disease includes screening for blood pressure, fasting lipids, glucose/diabetes screening, BMI height to weight ratio, reviewed of smoking status, physical activity, and diet.    Goals include blood pressure 120/80 or less, maintaining a healthy lipid/cholesterol profile, preventing diabetes or keeping diabetes numbers under good control, not smoking or using tobacco products, exercising most days per week or at least 150 minutes per week of exercise, and eating healthy variety of fruits and vegetables, healthy oils, and avoiding unhealthy food choices like fried food, fast food, high sugar and high cholesterol foods.    Other tests may possibly include EKG test, CT coronary calcium score, echocardiogram, exercise treadmill stress test.     Medical care options: I recommend you continue to seek care here first for routine care.  We try really hard to have available appointments Monday through Friday daytime hours for sick visits, acute visits, and physicals.  Urgent care should be used for after hours and weekends for significant issues that cannot wait till the next day.  The emergency department should be used for significant potentially life-threatening emergencies.  The emergency department is expensive, can often have long wait times for less significant concerns, so try to utilize primary care, urgent care, or telemedicine when possible to avoid unnecessary trips to the emergency department.  Virtual visits and telemedicine have been introduced since the pandemic started in 2020, and can be convenient ways to receive medical care.  We offer virtual appointments as well to assist you in a variety of options to seek medical care.   Advanced Directives: I recommend you consider completing a [place] and Living Will.   These documents respect your wishes and help alleviate burdens on your loved ones if you were to become terminally ill or be in a  position to need those documents enforced.    You can complete Advanced Directives yourself, have them notarized, then have copies made for our office, for you and for anybody you feel should have them in safe keeping.  Or, you can have an attorney prepare these documents.   If you haven't updated your Last Will and Testament in a while, it may be worthwhile having an attorney prepare these documents together and save on some costs.       Separate significant issues discussed: Hypertension-continue current medication  History of iron deficiency anemia-updated labs today  Prediabetes, screen for diabetes-updated labs today  Family history of colon cancer-referral to colon cancer screening  Prior high cholesterol-pending labs we will likely recommend statin

## 2022-01-23 ENCOUNTER — Other Ambulatory Visit (HOSPITAL_COMMUNITY): Payer: Self-pay

## 2022-01-23 ENCOUNTER — Encounter: Payer: Self-pay | Admitting: Internal Medicine

## 2022-01-23 ENCOUNTER — Other Ambulatory Visit: Payer: Self-pay | Admitting: Medical

## 2022-01-23 DIAGNOSIS — I10 Essential (primary) hypertension: Secondary | ICD-10-CM

## 2022-01-23 MED ORDER — ROSUVASTATIN CALCIUM 10 MG PO TABS
10.0000 mg | ORAL_TABLET | Freq: Every day | ORAL | 0 refills | Status: DC
  Filled 2022-01-23: qty 90, 90d supply, fill #0

## 2022-01-23 MED ORDER — FERROUS GLUCONATE 324 (38 FE) MG PO TABS
324.0000 mg | ORAL_TABLET | Freq: Every day | ORAL | 0 refills | Status: DC
  Filled 2022-01-23 – 2022-04-11 (×2): qty 90, 90d supply, fill #0

## 2022-01-23 MED ORDER — AMLODIPINE BESYLATE 10 MG PO TABS
10.0000 mg | ORAL_TABLET | Freq: Every day | ORAL | 3 refills | Status: DC
  Filled 2022-01-23: qty 90, 90d supply, fill #0
  Filled 2022-05-28: qty 90, 90d supply, fill #1
  Filled 2022-08-22: qty 90, 90d supply, fill #2

## 2022-01-24 LAB — COMPREHENSIVE METABOLIC PANEL
ALT: 27 IU/L (ref 0–32)
AST: 21 IU/L (ref 0–40)
Albumin/Globulin Ratio: 1.3 (ref 1.2–2.2)
Albumin: 4.1 g/dL (ref 3.8–4.9)
Alkaline Phosphatase: 96 IU/L (ref 44–121)
BUN/Creatinine Ratio: 13 (ref 9–23)
BUN: 8 mg/dL (ref 6–24)
Bilirubin Total: 0.2 mg/dL (ref 0.0–1.2)
CO2: 23 mmol/L (ref 20–29)
Calcium: 10.1 mg/dL (ref 8.7–10.2)
Chloride: 104 mmol/L (ref 96–106)
Creatinine, Ser: 0.63 mg/dL (ref 0.57–1.00)
Globulin, Total: 3.1 g/dL (ref 1.5–4.5)
Glucose: 102 mg/dL — ABNORMAL HIGH (ref 70–99)
Potassium: 4.4 mmol/L (ref 3.5–5.2)
Sodium: 143 mmol/L (ref 134–144)
Total Protein: 7.2 g/dL (ref 6.0–8.5)
eGFR: 106 mL/min/{1.73_m2} (ref >59)

## 2022-01-24 LAB — LIPID PANEL
Chol/HDL Ratio: 3.5 ratio (ref 0.0–4.4)
Cholesterol, Total: 227 mg/dL — ABNORMAL HIGH (ref 100–199)
HDL: 65 mg/dL (ref >39)
LDL Chol Calc (NIH): 141 mg/dL — ABNORMAL HIGH (ref 0–99)
Triglycerides: 121 mg/dL (ref 0–149)
VLDL Cholesterol Cal: 21 mg/dL (ref 5–40)

## 2022-01-24 LAB — CBC WITH DIFFERENTIAL/PLATELET
Basophils Absolute: 0.1 10*3/uL (ref 0.0–0.2)
Basos: 1 %
EOS (ABSOLUTE): 0.3 10*3/uL (ref 0.0–0.4)
Eos: 5 %
Hematocrit: 38.1 % (ref 34.0–46.6)
Hemoglobin: 11.7 g/dL (ref 11.1–15.9)
Immature Grans (Abs): 0 10*3/uL (ref 0.0–0.1)
Immature Granulocytes: 0 %
Lymphocytes Absolute: 2.3 10*3/uL (ref 0.7–3.1)
Lymphs: 38 %
MCH: 23 pg — ABNORMAL LOW (ref 26.6–33.0)
MCHC: 30.7 g/dL — ABNORMAL LOW (ref 31.5–35.7)
MCV: 75 fL — ABNORMAL LOW (ref 79–97)
Monocytes Absolute: 0.4 10*3/uL (ref 0.1–0.9)
Monocytes: 6 %
Neutrophils Absolute: 2.9 10*3/uL (ref 1.4–7.0)
Neutrophils: 50 %
Platelets: 478 10*3/uL — ABNORMAL HIGH (ref 150–450)
RBC: 5.09 x10E6/uL (ref 3.77–5.28)
RDW: 17.7 % — ABNORMAL HIGH (ref 11.7–15.4)
WBC: 5.9 10*3/uL (ref 3.4–10.8)

## 2022-01-24 LAB — RPR+HIV+GC+CT PANEL
Chlamydia trachomatis, NAA: NEGATIVE
HIV Screen 4th Generation wRfx: NONREACTIVE
Neisseria Gonorrhoeae by PCR: NEGATIVE
RPR Ser Ql: NONREACTIVE

## 2022-01-24 LAB — IRON,TIBC AND FERRITIN PANEL
Ferritin: 18 ng/mL (ref 15–150)
Iron Saturation: 7 % — CL (ref 15–55)
Iron: 30 ug/dL (ref 27–159)
Total Iron Binding Capacity: 417 ug/dL (ref 250–450)
UIBC: 387 ug/dL (ref 131–425)

## 2022-01-24 LAB — URINE CULTURE

## 2022-01-24 LAB — HEMOGLOBIN A1C
Est. average glucose Bld gHb Est-mCnc: 126 mg/dL
Hgb A1c MFr Bld: 6 % — ABNORMAL HIGH (ref 4.8–5.6)

## 2022-01-25 ENCOUNTER — Other Ambulatory Visit (HOSPITAL_COMMUNITY): Payer: Self-pay

## 2022-01-25 ENCOUNTER — Other Ambulatory Visit: Payer: Self-pay | Admitting: Medical

## 2022-01-25 MED ORDER — NITROFURANTOIN MONOHYD MACRO 100 MG PO CAPS
100.0000 mg | ORAL_CAPSULE | Freq: Two times a day (BID) | ORAL | 0 refills | Status: DC
  Filled 2022-01-25: qty 14, 7d supply, fill #0

## 2022-01-25 NOTE — Progress Notes (Signed)
acr

## 2022-01-30 ENCOUNTER — Encounter: Payer: Self-pay | Admitting: Internal Medicine

## 2022-02-12 ENCOUNTER — Ambulatory Visit (AMBULATORY_SURGERY_CENTER): Payer: Self-pay

## 2022-02-12 ENCOUNTER — Other Ambulatory Visit (HOSPITAL_COMMUNITY): Payer: Self-pay

## 2022-02-12 VITALS — Ht 62.0 in | Wt 174.0 lb

## 2022-02-12 DIAGNOSIS — Z1211 Encounter for screening for malignant neoplasm of colon: Secondary | ICD-10-CM

## 2022-02-12 DIAGNOSIS — Z800 Family history of malignant neoplasm of digestive organs: Secondary | ICD-10-CM

## 2022-02-12 MED ORDER — NA SULFATE-K SULFATE-MG SULF 17.5-3.13-1.6 GM/177ML PO SOLN
1.0000 | Freq: Once | ORAL | 0 refills | Status: AC
  Filled 2022-02-12 – 2022-02-28 (×2): qty 354, 1d supply, fill #0

## 2022-02-12 NOTE — Progress Notes (Signed)
No egg or soy allergy known to patient  No issues known to pt with past sedation with any surgeries or procedures Patient denies ever being told they had issues or difficulty with intubation  No FH of Malignant Hyperthermia Pt is not on diet pills Pt is not on home 02  Pt is not on blood thinners  Pt denies issues with constipation  No A fib or A flutter Have any cardiac testing pending--NO Pt instructed to use Singlecare.com or GoodRx for a price reduction on prep   Insurance verified during [place] appt=[facility]  Patient's chart reviewed by Osvaldo Angst CNRA prior to previsit and patient appropriate for the [place].  Previsit completed and red dot placed by patient's name on their procedure day (on provider's schedule).

## 2022-02-21 ENCOUNTER — Other Ambulatory Visit (HOSPITAL_COMMUNITY): Payer: Self-pay

## 2022-02-28 ENCOUNTER — Other Ambulatory Visit (HOSPITAL_COMMUNITY): Payer: Self-pay

## 2022-03-06 ENCOUNTER — Encounter: Payer: Self-pay | Admitting: Internal Medicine

## 2022-03-12 ENCOUNTER — Ambulatory Visit (AMBULATORY_SURGERY_CENTER): Payer: No Typology Code available for payment source | Admitting: Internal Medicine

## 2022-03-12 ENCOUNTER — Encounter: Payer: Self-pay | Admitting: Internal Medicine

## 2022-03-12 VITALS — BP 132/88 | HR 74 | Temp 98.0°F | Resp 15 | Ht 62.0 in | Wt 174.0 lb

## 2022-03-12 DIAGNOSIS — Z1211 Encounter for screening for malignant neoplasm of colon: Secondary | ICD-10-CM | POA: Diagnosis not present

## 2022-03-12 DIAGNOSIS — Z800 Family history of malignant neoplasm of digestive organs: Secondary | ICD-10-CM

## 2022-03-12 HISTORY — PX: COLONOSCOPY: SHX174

## 2022-03-12 MED ORDER — SODIUM CHLORIDE 0.9 % IV SOLN: 500.0000 mL | Freq: Once | INTRAVENOUS | Status: DC

## 2022-03-12 NOTE — Patient Instructions (Addendum)
-   Repeat colonoscopy in 5 years for screening purposes. - The findings and recommendations were discussed with the patient  (Hemorrhoids)  YOU HAD AN ENDOSCOPIC PROCEDURE TODAY AT [facility]:   Refer to the procedure report that was given to you for any specific questions about what was found during the examination.  If the procedure report does not answer your questions, please call your gastroenterologist to clarify.  If you requested that your care partner not be given the details of your procedure findings, then the procedure report has been included in a sealed envelope for you to review at your convenience later.  YOU SHOULD EXPECT: Some feelings of bloating in the abdomen. Passage of more gas than usual.  Walking can help get rid of the air that was put into your GI tract during the procedure and reduce the bloating. If you had a lower endoscopy (such as a colonoscopy or flexible sigmoidoscopy) you may notice spotting of blood in your stool or on the toilet paper. If you underwent a bowel prep for your procedure, you may not have a normal bowel movement for a few days.  Please Note:  You might notice some irritation and congestion in your nose or some drainage.  This is from the oxygen used during your procedure.  There is no need for concern and it should clear up in a day or so.  SYMPTOMS TO REPORT IMMEDIATELY:  Following lower endoscopy (colonoscopy or flexible sigmoidoscopy):  Excessive amounts of blood in the stool  Significant tenderness or worsening of abdominal pains  Swelling of the abdomen that is new, acute  Fever of 100F or higher   For urgent or emergent issues, a gastroenterologist can be reached at any hour by calling [phone]. Do not use MyChart messaging for urgent concerns.    DIET:  We do recommend a small meal at first, but then you may proceed to your regular diet.  Drink plenty of fluids but you should avoid alcoholic beverages for 24  hours.  ACTIVITY:  You should plan to take it easy for the rest of today and you should NOT DRIVE or use heavy machinery until tomorrow (because of the sedation medicines used during the test).    FOLLOW UP: Our staff will call the number listed on your records the next business day following your procedure.  We will call around 7:15- 8:00 am to check on you and address any questions or concerns that you may have regarding the information given to you following your procedure. If we do not reach you, we will leave a message.     If any biopsies were taken you will be contacted by phone or by letter within the next 1-3 weeks.  Please call us at [phone] if you have not heard about the biopsies in 3 weeks.    SIGNATURES/CONFIDENTIALITY: You and/or your care partner have signed paperwork which will be entered into your electronic medical record.  These signatures attest to the fact that that the information above on your After Visit Summary has been reviewed and is understood.  Full responsibility of the confidentiality of this discharge information lies with you and/or your care-partner.

## 2022-03-12 NOTE — Progress Notes (Signed)
VS completed by DT.  Pt's states no medical or surgical changes since previsit or office visit.  

## 2022-03-12 NOTE — Op Note (Signed)
[facility] Patient Name: Pamela Todd Procedure Date: 03/12/2022 8:34 AM MRN: [id number] Endoscopist: Adline Mango [place] , , [id number] Age: 51 Referring MD:  Date of Birth: 1971/02/02 Gender: Female Account #: [id number] Procedure:                Colonoscopy Indications:              Screening in patient at increased risk: Family                            history of 1st-degree relative with colorectal                            cancer before age 59 years, This is the patient's                            first colonoscopy Medicines:                Monitored Anesthesia Care Procedure:                Pre-Anesthesia Assessment:                           - Prior to the procedure, a History and Physical                            was performed, and patient medications and                            allergies were reviewed. The patient's tolerance of                            previous anesthesia was also reviewed. The risks                            and benefits of the procedure and the sedation                            options and risks were discussed with the patient.                            All questions were answered, and informed consent                            was obtained. Prior Anticoagulants: The patient has                            taken no anticoagulant or antiplatelet agents. ASA                            Grade Assessment: II - A patient with mild systemic                            disease. After reviewing the risks and benefits,  the patient was deemed in satisfactory condition to                            undergo the procedure.                           After obtaining informed consent, the colonoscope                            was passed under direct vision. Throughout the                            procedure, the patient's blood pressure, pulse, and                            oxygen saturations were monitored  continuously. The                            CF [id number] #[id number] was introduced through the anus                            and advanced to the the terminal ileum. The                            colonoscopy was performed without difficulty. The                            patient tolerated the procedure well. The quality                            of the bowel preparation was good. The terminal                            ileum, ileocecal valve, appendiceal orifice, and                            rectum were photographed. Scope In: 8:48:10 AM Scope Out: 9:03:47 AM Scope Withdrawal Time: 0 hours 10 minutes 17 seconds  Total Procedure Duration: 0 hours 15 minutes 37 seconds  Findings:                 The terminal ileum appeared normal.                           Non-bleeding internal hemorrhoids were found during                            retroflexion.                           The exam was otherwise without abnormality. Complications:            No immediate complications. Estimated Blood Loss:     Estimated blood loss was minimal. Impression:               - The examined portion of the ileum was normal.                           -  Non-bleeding internal hemorrhoids.                           - The examination was otherwise normal.                           - No specimens collected. Recommendation:           - Discharge patient to home (with escort).                           - Repeat colonoscopy in 5 years for screening                            purposes.                           - The findings and recommendations were discussed                            with the patient. Dr Georgian Co "Lyndee Leo" Lorenso Courier,  03/12/2022 9:12:30 AM

## 2022-03-12 NOTE — Progress Notes (Signed)
GASTROENTEROLOGY PROCEDURE H&P NOTE   Primary Care Physician: Carlena Hurl, PA-C    Reason for Procedure:   Colon cancer screening, family history of colon cancer  Plan:    Colonoscopy   Patient is appropriate for endoscopic procedure(s) in the ambulatory ([facility]) setting.  The nature of the procedure, as well as the risks, benefits, and alternatives were carefully and thoroughly reviewed with the patient. Ample time for discussion and questions allowed. The patient understood, was satisfied, and agreed to proceed.     HPI: Pamela Todd is a 51 y.o. female who presents for colonoscopy for colon cancer screening. Mother had colon cancer at age 32. Denies blood in stools, changes in bowel habits, unintentional weight loss.  Past Medical History:  Diagnosis Date   Abnormal Pap smear of cervix    Anemia    on daily iron med   Anxiety    Blood transfusion without reported diagnosis 2021   Elevated LDL cholesterol level 05/23/2020   Hyperlipidemia    on meds   Hypertension    on meds   Iron deficiency 05/23/2020   Migraines    Ovarian cyst    Prediabetes 05/23/2020   not on meds-diet controlled   Thrombocytosis 05/23/2020    Past Surgical History:  Procedure Laterality Date   CERVICAL BIOPSY  W/ LOOP ELECTRODE EXCISION     2016   CHOLECYSTECTOMY     DEEP NECK LYMPH NODE BIOPSY / EXCISION     TUBAL LIGATION      Prior to Admission medications   Medication Sig Start Date End Date Taking? Authorizing Provider  amLODipine (NORVASC) 10 MG tablet Take 1 tablet (10 mg total) by mouth daily. 01/23/22  Yes Tysinger, Camelia Eng, PA-C  Estradiol (YUVAFEM) 10 MCG TABS vaginal tablet Place 1 tablet (10 mcg total) vaginally 2 (two) times a week. 10/19/21  Yes Chrzanowski, Jami B, NP  rosuvastatin (CRESTOR) 10 MG tablet Take 1 tablet (10 mg total) by mouth daily. 01/23/22 01/23/23 Yes Tysinger, Camelia Eng, PA-C  ferrous gluconate (FERGON) 324 MG tablet Take 1 tablet (324 mg total)  by mouth daily with breakfast. 01/23/22   Tysinger, Camelia Eng, PA-C    Current Outpatient Medications  Medication Sig Dispense Refill   amLODipine (NORVASC) 10 MG tablet Take 1 tablet (10 mg total) by mouth daily. 90 tablet 3   Estradiol (YUVAFEM) 10 MCG TABS vaginal tablet Place 1 tablet (10 mcg total) vaginally 2 (two) times a week. 24 tablet 3   rosuvastatin (CRESTOR) 10 MG tablet Take 1 tablet (10 mg total) by mouth daily. 90 tablet 0   ferrous gluconate (FERGON) 324 MG tablet Take 1 tablet (324 mg total) by mouth daily with breakfast. 90 tablet 0   Current Facility-Administered Medications  Medication Dose Route Frequency Provider Last Rate Last Admin   0.9 %  sodium chloride infusion  500 mL Intravenous Once Sharyn Creamer, MD        Allergies as of 03/12/2022   (No Known Allergies)    Family History  Problem Relation Age of Onset   Colon polyps Mother 60   Hypertension Mother    Colon cancer Mother 62   Heart disease Mother    Heart disease Father    Hypertension Father    Diabetes Sister    Pneumonia Sister    Heart disease Sister    Hypertension Sister    Heart disease Sister    Colon polyps Sister    Diabetes Sister  Hypertension Sister    Diabetes Brother    Heart attack Brother    Stroke Brother    Heart disease Brother    Diabetes Brother    Heart disease Maternal Grandmother    Crohn's disease Neg Hx    Esophageal cancer Neg Hx    Stomach cancer Neg Hx    Rectal cancer Neg Hx     Social History   Socioeconomic History   Marital status: Married    Spouse name: Not on file   Number of children: Not on file   Years of education: Not on file   Highest education level: Not on file  Occupational History   Not on file  Tobacco Use   Smoking status: Never   Smokeless tobacco: Never  Vaping Use   Vaping Use: Never used  Substance and Sexual Activity   Alcohol use: Not Currently    Comment: special occasions   Drug use: Never   Sexual activity: Yes     Partners: Male    Birth control/protection: Surgical    Comment: BTL  Other Topics Concern   Not on file  Social History Narrative   Lives alone.   Nursing Network engineer.   Not a lot of exercise.   01/2022.   Social Determinants of Health   Financial Resource Strain: Not on file  Food Insecurity: Not on file  Transportation Needs: Not on file  Physical Activity: Not on file  Stress: Not on file  Social Connections: Not on file  Intimate Partner Violence: Not on file    Physical Exam: Vital signs in last 24 hours: BP 139/87   Pulse 91   Temp 98 F (36.7 C) (Temporal)   Ht '5\' 2"'$  (1.575 m)   Wt 174 lb (78.9 kg)   SpO2 98%   BMI 31.83 kg/m  GEN: NAD EYE: Sclerae anicteric ENT: MMM CV: Non-tachycardic Pulm: No increased work of breathing GI: Soft, NT/ND NEURO:  Alert & Oriented   Christia Reading, MD [facility]  03/12/2022 8:27 AM

## 2022-03-12 NOTE — Progress Notes (Signed)
Report to PACU, RN, vss, BBS= Clear.  

## 2022-03-13 ENCOUNTER — Telehealth: Payer: Self-pay

## 2022-03-13 NOTE — Telephone Encounter (Signed)
  Follow up Call-     03/12/2022    7:25 AM  Call back number  Post procedure Call Back phone  # [phone]  Permission to leave phone message Yes     Patient questions:  Do you have a fever, pain , or abdominal swelling? No. Pain Score  0 *  Have you tolerated food without any problems? Yes.    Have you been able to return to your normal activities? Yes.    Do you have any questions about your discharge instructions: Diet   No. Medications  No. Follow up visit  No.  Do you have questions or concerns about your Care? No.  Actions: * If pain score is 4 or above: No action needed, pain <4.

## 2022-04-11 ENCOUNTER — Other Ambulatory Visit (HOSPITAL_COMMUNITY): Payer: Self-pay

## 2022-04-27 ENCOUNTER — Other Ambulatory Visit (HOSPITAL_COMMUNITY): Payer: Self-pay

## 2022-05-08 ENCOUNTER — Other Ambulatory Visit (HOSPITAL_COMMUNITY): Payer: Self-pay

## 2022-05-28 ENCOUNTER — Other Ambulatory Visit: Payer: Self-pay | Admitting: Medical

## 2022-05-28 ENCOUNTER — Other Ambulatory Visit: Payer: Self-pay

## 2022-05-28 ENCOUNTER — Other Ambulatory Visit (HOSPITAL_COMMUNITY): Payer: Self-pay

## 2022-05-28 MED ORDER — ROSUVASTATIN CALCIUM 10 MG PO TABS
10.0000 mg | ORAL_TABLET | Freq: Every day | ORAL | 1 refills | Status: DC
  Filled 2022-05-28: qty 90, 90d supply, fill #0
  Filled 2022-08-22: qty 90, 90d supply, fill #1

## 2022-08-22 ENCOUNTER — Other Ambulatory Visit (HOSPITAL_COMMUNITY): Payer: Self-pay

## 2022-11-16 IMAGING — MG DIGITAL SCREENING BILAT W/ CAD
5 series · 5 of 5 positions shown · non-contrast
Comparison: None.

CLINICAL DATA: Screening.

EXAM:
DIGITAL SCREENING BILATERAL MAMMOGRAM WITH CAD
TECHNIQUE: Bilateral screening digital craniocaudal and mediolateral oblique
mammograms were obtained. The images were evaluated with
computer-aided detection.

[R MLO]
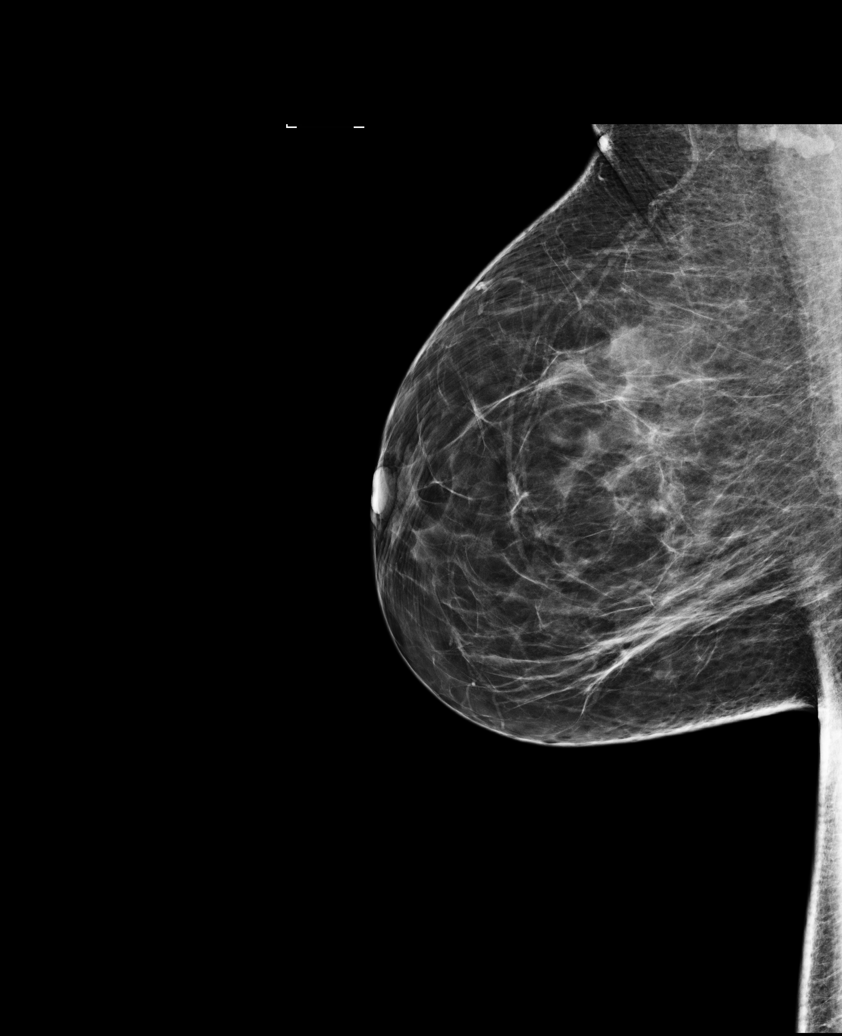

[L MLO (1 of 2)]
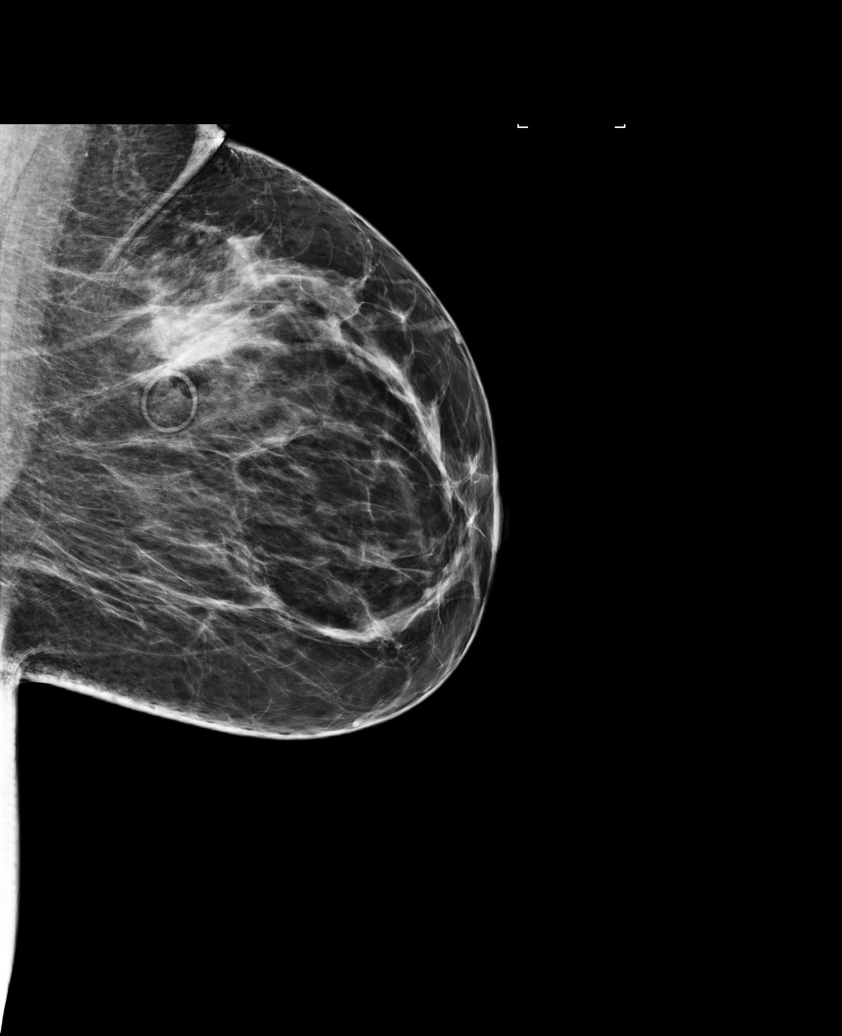

[L CC]
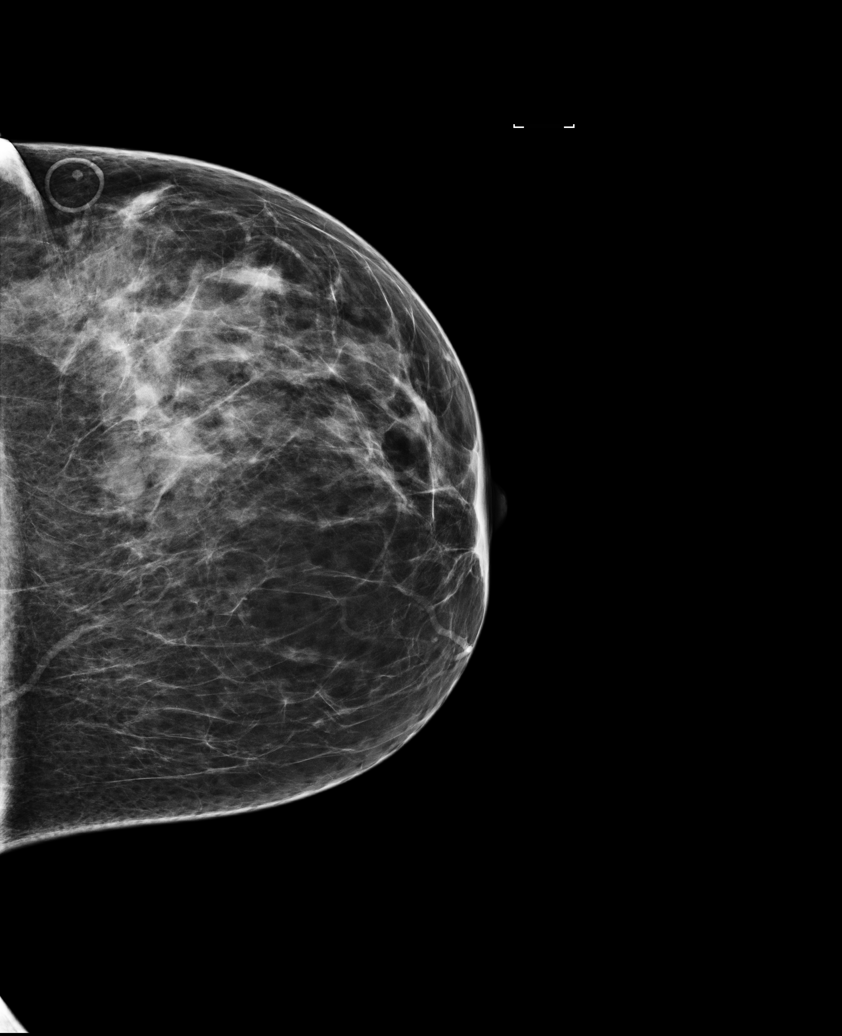

[R CC]
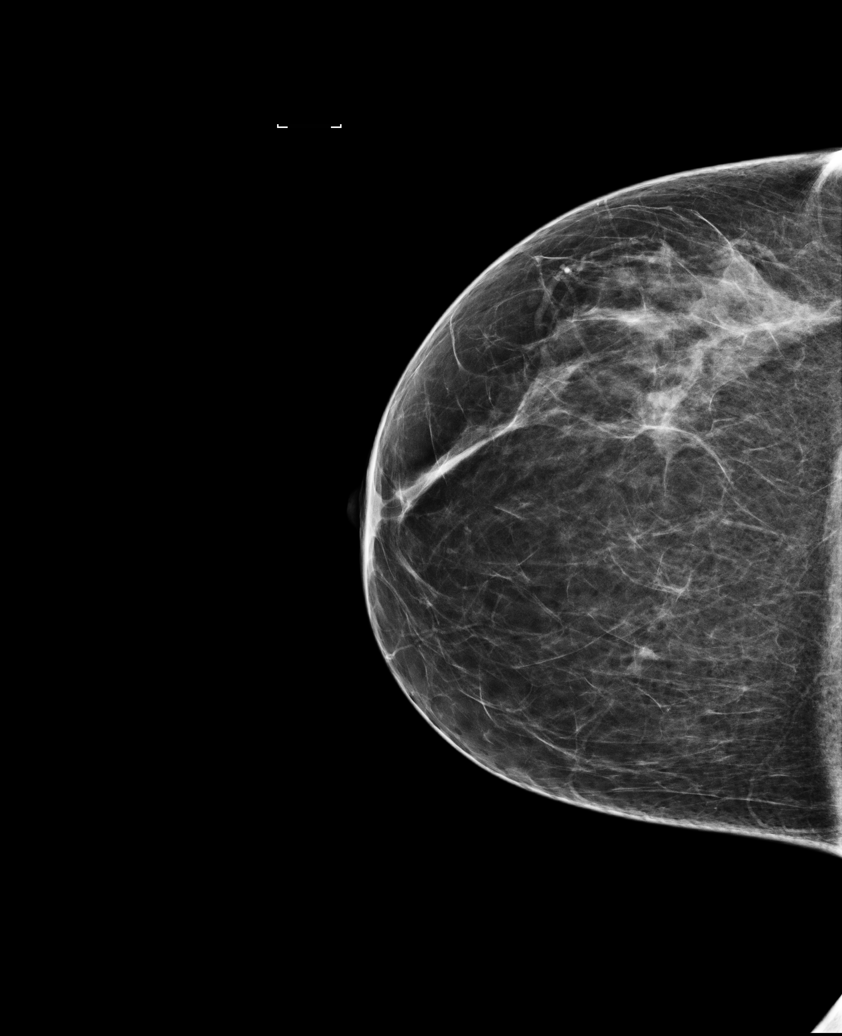

[L MLO (2 of 2)]
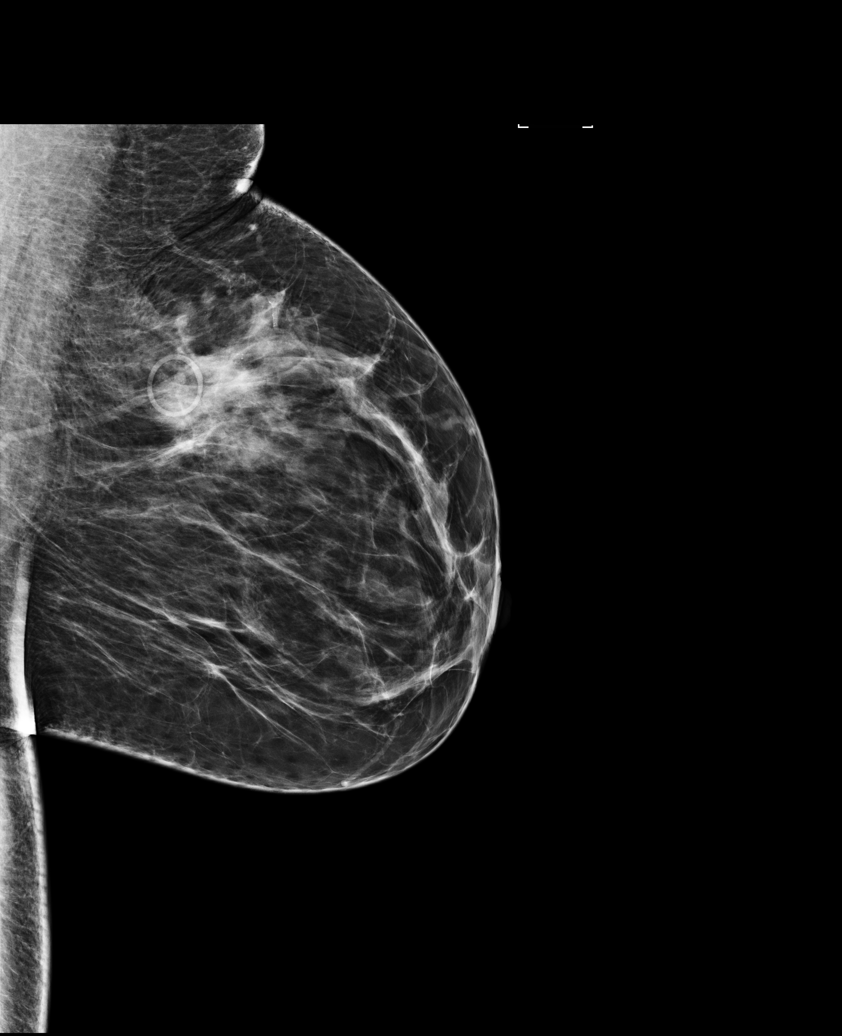

[5 of 5 positions shown; findings below may reference images not displayed]

ACR Breast Density Category b: There are scattered areas of
fibroglandular density.
FINDINGS: There are no findings suspicious for malignancy.
IMPRESSION: No mammographic evidence of malignancy. A result letter of this
screening mammogram will be mailed directly to the patient.

RECOMMENDATION:
Screening mammogram in one year. (Code:[id number])

BI-RADS CATEGORY  1: Negative.

## 2023-06-03 ENCOUNTER — Other Ambulatory Visit: Payer: Self-pay | Admitting: Medical

## 2023-06-03 ENCOUNTER — Encounter: Payer: Self-pay | Admitting: Internal Medicine

## 2023-06-03 DIAGNOSIS — I10 Essential (primary) hypertension: Secondary | ICD-10-CM

## 2023-06-04 ENCOUNTER — Other Ambulatory Visit (HOSPITAL_COMMUNITY): Payer: Self-pay

## 2023-06-04 ENCOUNTER — Encounter (HOSPITAL_COMMUNITY): Payer: Self-pay | Admitting: Pharmacist

## 2023-07-31 ENCOUNTER — Encounter: Payer: Self-pay | Admitting: Medical

## 2023-11-27 ENCOUNTER — Ambulatory Visit: Admitting: Medical

## 2023-11-27 ENCOUNTER — Other Ambulatory Visit (HOSPITAL_COMMUNITY): Payer: Self-pay

## 2023-11-27 ENCOUNTER — Encounter: Payer: Self-pay | Admitting: Medical

## 2023-11-27 VITALS — BP 138/82 | HR 83 | Ht 64.0 in | Wt 175.6 lb

## 2023-11-27 DIAGNOSIS — E611 Iron deficiency: Secondary | ICD-10-CM

## 2023-11-27 DIAGNOSIS — I10 Essential (primary) hypertension: Secondary | ICD-10-CM

## 2023-11-27 DIAGNOSIS — R7303 Prediabetes: Secondary | ICD-10-CM

## 2023-11-27 DIAGNOSIS — E559 Vitamin D deficiency, unspecified: Secondary | ICD-10-CM | POA: Diagnosis not present

## 2023-11-27 DIAGNOSIS — E7800 Pure hypercholesterolemia, unspecified: Secondary | ICD-10-CM | POA: Diagnosis not present

## 2023-11-27 DIAGNOSIS — Z0000 Encounter for general adult medical examination without abnormal findings: Secondary | ICD-10-CM

## 2023-11-27 DIAGNOSIS — Z800 Family history of malignant neoplasm of digestive organs: Secondary | ICD-10-CM

## 2023-11-27 MED ORDER — AMLODIPINE BESYLATE 5 MG PO TABS
5.0000 mg | ORAL_TABLET | Freq: Every day | ORAL | 2 refills | Status: AC
  Filled 2023-11-27: qty 90, 90d supply, fill #0
  Filled 2024-02-20: qty 90, 90d supply, fill #1

## 2023-11-27 NOTE — Patient Instructions (Signed)
 This visit was a preventative care visit, also known as wellness visit or routine physical.   Topics typically include healthy lifestyle, diet, exercise, preventative care, vaccinations, sick and well care, proper use of emergency dept and after hours care, as well as other concerns.     Recommendations: Continue to return yearly for your annual wellness and preventative care visits.  This gives us  a chance to discuss healthy lifestyle, exercise, vaccinations, review your chart record, and perform screenings where appropriate.  I recommend you see your eye doctor yearly for routine vision care.  I recommend you see your dentist yearly for routine dental care including hygiene visits twice yearly.  See your gynecologist yearly for routine gynecological care.    Vaccination recommendations were reviewed Immunization History  Administered Date(s) Administered   Influenza-Unspecified 01/05/2019, 02/17/2020   PFIZER(Purple Top)SARS-COV-2 Vaccination 08/14/2019, 09/11/2019   Tdap 01/05/2019   Vaccine recommendations: Yearly flu shot in the fall Shingles vaccine Pneumococcal vaccine   Screening for cancer: Colon cancer screening: I reviewed your 2023 colonoscopy report.  You are due for repeat in 2028  Breast cancer screening: You should perform a self breast exam monthly.   We reviewed recommendations for regular mammograms and breast cancer screening.  Follow-up with gynecology soon  Cervical cancer screening: We reviewed recommendations for pap smear screening.  Follow-up with gynecology soon given history of abnormal Pap smear   Skin cancer screening: Check your skin regularly for new changes, growing lesions, or other lesions of concern Come in for evaluation if you have skin lesions of concern.  Lung cancer screening: If you have a greater than 20 pack year history of tobacco use, then you may qualify for lung cancer screening with a chest CT scan.   Please call your  insurance company to inquire about coverage for this test.  We currently don't have screenings for other cancers besides breast, cervical, colon, and lung cancers.  If you have a strong family history of cancer or have other cancer screening concerns, please let me know.    Bone health: Get at least 150 minutes of aerobic exercise weekly Get weight bearing exercise at least once weekly Bone density test:  A bone density test is an imaging test that uses a type of X-ray to measure the amount of calcium  and other minerals in your bones. The test may be used to diagnose or screen you for a condition that causes weak or thin bones (osteoporosis), predict your risk for a broken bone (fracture), or determine how well your osteoporosis treatment is working. The bone density test is recommended for females 65 and older, or females or males <65 if certain risk factors such as thyroid disease, long term use of steroids such as for asthma or rheumatological issues, vitamin D  deficiency, estrogen deficiency, family history of osteoporosis, self or family history of fragility fracture in first degree relative.    Heart health: Get at least 150 minutes of aerobic exercise weekly Limit alcohol It is important to maintain a healthy blood pressure and healthy cholesterol numbers  Heart disease screening: Screening for heart disease includes screening for blood pressure, fasting lipids, glucose/diabetes screening, BMI height to weight ratio, reviewed of smoking status, physical activity, and diet.    Goals include blood pressure 120/80 or less, maintaining a healthy lipid/cholesterol profile, preventing diabetes or keeping diabetes numbers under good control, not smoking or using tobacco products, exercising most days per week or at least 150 minutes per week of exercise, and eating healthy  variety of fruits and vegetables, healthy oils, and avoiding unhealthy food choices like fried food, fast food, high sugar  and high cholesterol foods.    Other tests may possibly include EKG test, CT coronary calcium  score, echocardiogram, exercise treadmill stress test.   I recommend a CT coronary calcium  test.  Let me know if you are agreeable to pursue this   Medical care options: I recommend you continue to seek care here first for routine care.  We try really hard to have available appointments Monday through Friday daytime hours for sick visits, acute visits, and physicals.  Urgent care should be used for after hours and weekends for significant issues that cannot wait till the next day.  The emergency department should be used for significant potentially life-threatening emergencies.  The emergency department is expensive, can often have long wait times for less significant concerns, so try to utilize primary care, urgent care, or telemedicine when possible to avoid unnecessary trips to the emergency department.  Virtual visits and telemedicine have been introduced since the pandemic started in 2020, and can be convenient ways to receive medical care.  We offer virtual appointments as well to assist you in a variety of options to seek medical care.   Advanced Directives: I recommend you consider completing a Health Care Power of Attorney and Living Will.   These documents respect your wishes and help alleviate burdens on your loved ones if you were to become terminally ill or be in a position to need those documents enforced.    You can complete Advanced Directives yourself, have them notarized, then have copies made for our office, for you and for anybody you feel should have them in safe keeping.  Or, you can have an attorney prepare these documents.   If you haven't updated your Last Will and Testament in a while, it may be worthwhile having an attorney prepare these documents together and save on some costs.       Separate significant issues discussed: Hypertension-restart amlodipine  at 5 mg daily    history of iron  deficiency anemia-return next week for labs  Prediabetes, screen for diabetes-return next week for labs  History of high cholesterol-return next week for labs.  Not currently on medication.

## 2023-11-27 NOTE — Progress Notes (Signed)
 Subjective:  Pamela Todd is a 53 y.o. female who presents for Chief Complaint  Patient presents with   Annual Exam    Fasting cpe, no concerns. Been without bp med for months. Due for mammogram, declines all vaccines today      Medical team: Dr. Amundson Silva, Jami Chrzanowski, NP, gynecology Dr. Rosario Estefana Kidney, GI Kamdon Reisig, Alm RAMAN, PA-C here for primary care   Concerns: Here for yearly physical.  She actually did not come in last year in 2024 for a well visit or follow-up.  Last visit here 2023.  She is also past due to see gynecology.  She has been out of her blood pressure medicine for months   No other aggravating or relieving factors.    No other c/o.   Past Medical History:  Diagnosis Date   Abnormal Pap smear of cervix    Anemia    on daily iron  med   Anxiety    Blood transfusion without reported diagnosis 2021   Elevated LDL cholesterol level 05/23/2020   Family history of colon cancer    Hyperlipidemia    on meds   Hypertension    on meds   Iron  deficiency 05/23/2020   Migraines    Ovarian cyst    Prediabetes 05/23/2020   not on meds-diet controlled   Thrombocytosis 05/23/2020    Family History  Problem Relation Age of Onset   Colon polyps Mother 38   Hypertension Mother    Colon cancer Mother 81   Heart disease Mother    Heart disease Father    Hypertension Father    Diabetes Sister    Pneumonia Sister    Heart disease Sister    Hypertension Sister    Heart disease Sister    Colon polyps Sister    Diabetes Sister    Hypertension Sister    Diabetes Brother    Heart attack Brother    Stroke Brother    Heart disease Brother    Diabetes Brother    Heart disease Maternal Grandmother    Crohn's disease Neg Hx    Esophageal cancer Neg Hx    Stomach cancer Neg Hx    Rectal cancer Neg Hx      Current Outpatient Medications:    ferrous gluconate  (FERGON) 324 MG tablet, Take 1 tablet (324 mg total) by mouth daily with breakfast.,  Disp: 90 tablet, Rfl: 0   rosuvastatin  (CRESTOR ) 10 MG tablet, Take 1 tablet (10 mg total) by mouth daily., Disp: 90 tablet, Rfl: 1   amLODipine  (NORVASC ) 10 MG tablet, Take 1 tablet (10 mg total) by mouth daily. (Patient not taking: Reported on 11/27/2023), Disp: 90 tablet, Rfl: 3  No Known Allergies   Reviewed their medical, surgical, family, social, medication, and allergy history and updated chart as appropriate.  Review of Systems  Constitutional:  Negative for chills, fever, malaise/fatigue and weight loss.  HENT:  Negative for congestion, ear pain, hearing loss, sore throat and tinnitus.   Eyes:  Negative for blurred vision, pain and redness.  Respiratory:  Negative for cough, hemoptysis and shortness of breath.   Cardiovascular:  Negative for chest pain, palpitations, orthopnea, claudication and leg swelling.  Gastrointestinal:  Negative for abdominal pain, blood in stool, constipation, diarrhea, nausea and vomiting.  Genitourinary:  Negative for dysuria, flank pain, frequency, hematuria and urgency.  Musculoskeletal:  Negative for falls, joint pain and myalgias.  Skin:  Negative for itching and rash.  Neurological:  Negative for dizziness, tingling,  speech change, weakness and headaches.  Endo/Heme/Allergies:  Negative for polydipsia. Does not bruise/bleed easily.  Psychiatric/Behavioral:  Negative for depression and memory loss. The patient is not nervous/anxious and does not have insomnia.          11/27/2023    8:47 AM 01/22/2022   10:14 AM  Depression screen PHQ 2/9  Decreased Interest 0 0  Down, Depressed, Hopeless 0 0  PHQ - 2 Score 0 0       Objective:  BP 138/82   Pulse 83   Ht 5' 4 (1.626 m)   Wt 175 lb 9.6 oz (79.7 kg)   LMP 10/25/2023   SpO2 100%   BMI 30.14 kg/m   General appearance: alert, no distress, WD/WN, African American female Skin: brown raised skin tags of bilat cheeks and lower orbits HEENT: normocephalic, conjunctiva/corneas normal, sclerae  anicteric, PERRLA, EOMi, nares patent, no discharge or erythema, pharynx normal Oral cavity: MMM, tongue normal, teeth with lots of plaque buildup  neck: supple, no lymphadenopathy, no thyromegaly, no masses, normal ROM, no bruits Chest: non tender, normal shape and expansion Heart: RRR, normal S1, S2, no murmurs Lungs: CTA bilaterally, no wheezes, rhonchi, or rales Abdomen: +bs, soft, non tender, non distended, no masses, no hepatomegaly, no splenomegaly, no bruits Back: non tender, normal ROM, no scoliosis Musculoskeletal: upper extremities non tender, no obvious deformity, normal ROM throughout, lower extremities non tender, no obvious deformity, normal ROM throughout Extremities: no edema, no cyanosis, no clubbing Pulses: 2+ symmetric, upper and lower extremities, normal cap refill Neurological: alert, oriented x 3, CN2-12 intact, strength normal upper extremities and lower extremities, sensation normal throughout, DTRs 2+ throughout, no cerebellar signs, gait normal Psychiatric: normal affect, behavior normal, pleasant  Breast/gyn/rectal - deferred to gynecology      Assessment and Plan :   Encounter Diagnoses  Name Primary?   Encounter for health maintenance examination in adult Yes   Primary hypertension    Prediabetes    Iron  deficiency    Family history of colon cancer in mother    Elevated LDL cholesterol level    Vitamin D  deficiency      This visit was a preventative care visit, also known as wellness visit or routine physical.   Topics typically include healthy lifestyle, diet, exercise, preventative care, vaccinations, sick and well care, proper use of emergency dept and after hours care, as well as other concerns.     Recommendations: Continue to return yearly for your annual wellness and preventative care visits.  This gives us  a chance to discuss healthy lifestyle, exercise, vaccinations, review your chart record, and perform screenings where appropriate.  I  recommend you see your eye doctor yearly for routine vision care.  I recommend you see your dentist yearly for routine dental care including hygiene visits twice yearly.  See your gynecologist yearly for routine gynecological care.    Vaccination recommendations were reviewed Immunization History  Administered Date(s) Administered   Influenza-Unspecified 01/05/2019, 02/17/2020   PFIZER(Purple Top)SARS-COV-2 Vaccination 08/14/2019, 09/11/2019   Tdap 01/05/2019   Vaccine recommendations: Yearly flu shot in the fall Shingles vaccine Pneumococcal vaccine   Screening for cancer: Colon cancer screening: I reviewed your 2023 colonoscopy report.  You are due for repeat in 2028  Breast cancer screening: You should perform a self breast exam monthly.   We reviewed recommendations for regular mammograms and breast cancer screening.  Follow-up with gynecology soon  Cervical cancer screening: We reviewed recommendations for pap smear screening.  Follow-up with  gynecology soon given history of abnormal Pap smear   Skin cancer screening: Check your skin regularly for new changes, growing lesions, or other lesions of concern Come in for evaluation if you have skin lesions of concern.  Lung cancer screening: If you have a greater than 20 pack year history of tobacco use, then you may qualify for lung cancer screening with a chest CT scan.   Please call your insurance company to inquire about coverage for this test.  We currently don't have screenings for other cancers besides breast, cervical, colon, and lung cancers.  If you have a strong family history of cancer or have other cancer screening concerns, please let me know.    Bone health: Get at least 150 minutes of aerobic exercise weekly Get weight bearing exercise at least once weekly Bone density test:  A bone density test is an imaging test that uses a type of X-ray to measure the amount of calcium  and other minerals in your  bones. The test may be used to diagnose or screen you for a condition that causes weak or thin bones (osteoporosis), predict your risk for a broken bone (fracture), or determine how well your osteoporosis treatment is working. The bone density test is recommended for females 65 and older, or females or males <65 if certain risk factors such as thyroid disease, long term use of steroids such as for asthma or rheumatological issues, vitamin D  deficiency, estrogen deficiency, family history of osteoporosis, self or family history of fragility fracture in first degree relative.    Heart health: Get at least 150 minutes of aerobic exercise weekly Limit alcohol It is important to maintain a healthy blood pressure and healthy cholesterol numbers  Heart disease screening: Screening for heart disease includes screening for blood pressure, fasting lipids, glucose/diabetes screening, BMI height to weight ratio, reviewed of smoking status, physical activity, and diet.    Goals include blood pressure 120/80 or less, maintaining a healthy lipid/cholesterol profile, preventing diabetes or keeping diabetes numbers under good control, not smoking or using tobacco products, exercising most days per week or at least 150 minutes per week of exercise, and eating healthy variety of fruits and vegetables, healthy oils, and avoiding unhealthy food choices like fried food, fast food, high sugar and high cholesterol foods.    Other tests may possibly include EKG test, CT coronary calcium  score, echocardiogram, exercise treadmill stress test.   I recommend a CT coronary calcium  test.  Let me know if you are agreeable to pursue this   Medical care options: I recommend you continue to seek care here first for routine care.  We try really hard to have available appointments Monday through Friday daytime hours for sick visits, acute visits, and physicals.  Urgent care should be used for after hours and weekends for  significant issues that cannot wait till the next day.  The emergency department should be used for significant potentially life-threatening emergencies.  The emergency department is expensive, can often have long wait times for less significant concerns, so try to utilize primary care, urgent care, or telemedicine when possible to avoid unnecessary trips to the emergency department.  Virtual visits and telemedicine have been introduced since the pandemic started in 2020, and can be convenient ways to receive medical care.  We offer virtual appointments as well to assist you in a variety of options to seek medical care.   Advanced Directives: I recommend you consider completing a Health Care Power of Attorney and Living Will.  These documents respect your wishes and help alleviate burdens on your loved ones if you were to become terminally ill or be in a position to need those documents enforced.    You can complete Advanced Directives yourself, have them notarized, then have copies made for our office, for you and for anybody you feel should have them in safe keeping.  Or, you can have an attorney prepare these documents.   If you haven't updated your Last Will and Testament in a while, it may be worthwhile having an attorney prepare these documents together and save on some costs.       Separate significant issues discussed: Hypertension-restart amlodipine  at 5 mg daily   history of iron  deficiency anemia-return next week for labs  Prediabetes, screen for diabetes-return next week for labs  History of high cholesterol-return next week for labs.  Not currently on medication.    Zarra was seen today for annual exam.  Diagnoses and all orders for this visit:  Encounter for health maintenance examination in adult -     CBC with Differential/Platelet; Future -     Comprehensive metabolic panel with GFR; Future -     Lipid panel; Future -     TSH; Future -     Hemoglobin A1c; Future -      Iron , TIBC and Ferritin Panel; Future -     Urinalysis, Routine w reflex microscopic; Future -     VITAMIN D  25 Hydroxy (Vit-D Deficiency, Fractures); Future  Primary hypertension  Prediabetes -     Hemoglobin A1c; Future  Iron  deficiency -     CBC with Differential/Platelet; Future -     Iron , TIBC and Ferritin Panel; Future  Family history of colon cancer in mother  Elevated LDL cholesterol level -     Lipid panel; Future  Vitamin D  deficiency -     VITAMIN D  25 Hydroxy (Vit-D Deficiency, Fractures); Future    Follow-up next week for fasting labs

## 2023-11-28 ENCOUNTER — Other Ambulatory Visit

## 2023-11-28 DIAGNOSIS — E559 Vitamin D deficiency, unspecified: Secondary | ICD-10-CM

## 2023-11-28 DIAGNOSIS — E7800 Pure hypercholesterolemia, unspecified: Secondary | ICD-10-CM

## 2023-11-28 DIAGNOSIS — Z0000 Encounter for general adult medical examination without abnormal findings: Secondary | ICD-10-CM | POA: Diagnosis not present

## 2023-11-28 DIAGNOSIS — E611 Iron deficiency: Secondary | ICD-10-CM | POA: Diagnosis not present

## 2023-11-28 DIAGNOSIS — R7303 Prediabetes: Secondary | ICD-10-CM

## 2023-11-29 ENCOUNTER — Ambulatory Visit: Payer: Self-pay | Admitting: Medical

## 2023-11-29 ENCOUNTER — Other Ambulatory Visit (HOSPITAL_COMMUNITY): Payer: Self-pay

## 2023-11-29 ENCOUNTER — Other Ambulatory Visit: Payer: Self-pay

## 2023-11-29 ENCOUNTER — Other Ambulatory Visit: Payer: Self-pay | Admitting: Medical

## 2023-11-29 LAB — COMPREHENSIVE METABOLIC PANEL WITH GFR
ALT: 30 IU/L (ref 0–32)
AST: 24 IU/L (ref 0–40)
Albumin: 4.1 g/dL (ref 3.8–4.9)
Alkaline Phosphatase: 86 IU/L (ref 44–121)
BUN/Creatinine Ratio: 17 (ref 9–23)
BUN: 10 mg/dL (ref 6–24)
Bilirubin Total: 0.2 mg/dL (ref 0.0–1.2)
CO2: 23 mmol/L (ref 20–29)
Calcium: 9.8 mg/dL (ref 8.7–10.2)
Chloride: 104 mmol/L (ref 96–106)
Creatinine, Ser: 0.6 mg/dL (ref 0.57–1.00)
Globulin, Total: 2.8 g/dL (ref 1.5–4.5)
Glucose: 119 mg/dL — ABNORMAL HIGH (ref 70–99)
Potassium: 4 mmol/L (ref 3.5–5.2)
Sodium: 142 mmol/L (ref 134–144)
Total Protein: 6.9 g/dL (ref 6.0–8.5)
eGFR: 107 mL/min/1.73 (ref >59)

## 2023-11-29 LAB — IRON,TIBC AND FERRITIN PANEL
Ferritin: 12 ng/mL — AB (ref 15–150)
Iron Saturation: 6 — AB (ref 15–55)
Iron: 25 ug/dL — AB (ref 27–159)
Total Iron Binding Capacity: 423 ug/dL (ref 250–450)
UIBC: 398 ug/dL (ref 131–425)

## 2023-11-29 LAB — URINALYSIS, ROUTINE W REFLEX MICROSCOPIC
Bilirubin, UA: NEGATIVE
Glucose, UA: NEGATIVE
Ketones, UA: NEGATIVE
Leukocytes,UA: NEGATIVE
Nitrite, UA: NEGATIVE
RBC, UA: NEGATIVE
Specific Gravity, UA: 1.026 (ref 1.005–1.030)
Urobilinogen, Ur: 0.2 mg/dL (ref 0.2–1.0)
pH, UA: 5.5 (ref 5.0–7.5)

## 2023-11-29 LAB — CBC WITH DIFFERENTIAL/PLATELET
Basophils Absolute: 0 x10E3/uL (ref 0.0–0.2)
Basos: 1 %
EOS (ABSOLUTE): 0.3 x10E3/uL (ref 0.0–0.4)
Eos: 6 %
Hematocrit: 39.3 % (ref 34.0–46.6)
Hemoglobin: 11.9 g/dL (ref 11.1–15.9)
Immature Grans (Abs): 0 x10E3/uL (ref 0.0–0.1)
Immature Granulocytes: 0 %
Lymphocytes Absolute: 2.4 x10E3/uL (ref 0.7–3.1)
Lymphs: 38 %
MCH: 24.1 pg — ABNORMAL LOW (ref 26.6–33.0)
MCHC: 30.3 g/dL — ABNORMAL LOW (ref 31.5–35.7)
MCV: 80 fL (ref 79–97)
Monocytes Absolute: 0.4 x10E3/uL (ref 0.1–0.9)
Monocytes: 6 %
Neutrophils Absolute: 3.1 x10E3/uL (ref 1.4–7.0)
Neutrophils: 49 %
Platelets: 510 x10E3/uL — ABNORMAL HIGH (ref 150–450)
RBC: 4.94 x10E6/uL (ref 3.77–5.28)
RDW: 15.2 % (ref 11.7–15.4)
WBC: 6.2 x10E3/uL (ref 3.4–10.8)

## 2023-11-29 LAB — VITAMIN D 25 HYDROXY (VIT D DEFICIENCY, FRACTURES): Vit D, 25-Hydroxy: 29.8 ng/mL — AB (ref 30.0–100.0)

## 2023-11-29 LAB — TSH: TSH: 3.23 u[IU]/mL (ref 0.450–4.500)

## 2023-11-29 LAB — HEMOGLOBIN A1C
Est. average glucose Bld gHb Est-mCnc: 140 mg/dL
Hgb A1c MFr Bld: 6.5 % — ABNORMAL HIGH (ref 4.8–5.6)

## 2023-11-29 LAB — LIPID PANEL
Chol/HDL Ratio: 4.5 ratio — ABNORMAL HIGH (ref 0.0–4.4)
Cholesterol, Total: 223 mg/dL — ABNORMAL HIGH (ref 100–199)
HDL: 50 mg/dL (ref >39)
LDL Chol Calc (NIH): 154 mg/dL — ABNORMAL HIGH (ref 0–99)
Triglycerides: 104 mg/dL (ref 0–149)
VLDL Cholesterol Cal: 19 mg/dL (ref 5–40)

## 2023-11-29 MED ORDER — FERROUS GLUCONATE 324 (38 FE) MG PO TABS
324.0000 mg | ORAL_TABLET | Freq: Two times a day (BID) | ORAL | 2 refills | Status: AC
  Filled 2023-11-29: qty 180, 90d supply, fill #0

## 2023-11-29 MED ORDER — VITAMIN D (ERGOCALCIFEROL) 1.25 MG (50000 UNIT) PO CAPS
50000.0000 [IU] | ORAL_CAPSULE | ORAL | 3 refills | Status: AC
  Filled 2023-11-29: qty 12, 84d supply, fill #0
  Filled 2024-02-20: qty 12, 84d supply, fill #1

## 2023-11-29 MED ORDER — ROSUVASTATIN CALCIUM 10 MG PO TABS
10.0000 mg | ORAL_TABLET | Freq: Every day | ORAL | 3 refills | Status: AC
  Filled 2023-11-29: qty 90, 90d supply, fill #0
  Filled 2024-03-25: qty 90, 90d supply, fill #1

## 2023-11-29 NOTE — Progress Notes (Signed)
Results through My Chart

## 2024-02-27 ENCOUNTER — Ambulatory Visit: Payer: Self-pay | Admitting: Medical

## 2024-02-27 VITALS — BP 110/64 | HR 82 | Wt 182.8 lb

## 2024-02-27 DIAGNOSIS — E1165 Type 2 diabetes mellitus with hyperglycemia: Secondary | ICD-10-CM | POA: Diagnosis not present

## 2024-02-27 DIAGNOSIS — E559 Vitamin D deficiency, unspecified: Secondary | ICD-10-CM

## 2024-02-27 DIAGNOSIS — R7303 Prediabetes: Secondary | ICD-10-CM

## 2024-02-27 DIAGNOSIS — I10 Essential (primary) hypertension: Secondary | ICD-10-CM

## 2024-02-27 DIAGNOSIS — E611 Iron deficiency: Secondary | ICD-10-CM

## 2024-02-27 DIAGNOSIS — E785 Hyperlipidemia, unspecified: Secondary | ICD-10-CM

## 2024-02-27 DIAGNOSIS — Z6831 Body mass index (BMI) 31.0-31.9, adult: Secondary | ICD-10-CM | POA: Diagnosis not present

## 2024-02-27 DIAGNOSIS — M7989 Other specified soft tissue disorders: Secondary | ICD-10-CM | POA: Diagnosis not present

## 2024-02-27 LAB — LIPID PANEL

## 2024-02-27 NOTE — Progress Notes (Signed)
 I am not sure what you are seeing. It is documented correctly in the chart.

## 2024-02-27 NOTE — Progress Notes (Signed)
 Sent referral

## 2024-02-27 NOTE — Progress Notes (Addendum)
 Subjective:  Pamela Todd is a 53 y.o. female who presents for Chief Complaint  Patient presents with   Follow-up    3 month follow-up on bp. Having some swelling in both ankles. Weight issue     Pamela Todd is a 53 year old female who presents for a follow-up visit.  She is currently taking amlodipine  5 mg daily for hypertension, Crestor  10 mg daily, vitamin D  50,000 IU weekly, and iron  324 mg twice daily, although she sometimes forgets the second dose. No constipation from the iron  supplementation and she drinks a lot of water.  Her last physical in August revealed low vitamin D  and iron  levels, prompting the initiation of vitamin D  supplementation. Her diabetes markers were at the threshold during her last physical. A colonoscopy one to two years ago was normal, and she has not had a menstrual period since the end of August.  She experiences bilateral ankle swelling for the past few months, initially noticed after a long drive in July. The swelling sometimes decreases by morning but not consistently. She has tried soaking her feet in Epsom salt with limited relief and finds compression socks uncomfortable. 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 The swelling has been worse in the past but is not significantly worse now.  No other aggravating or relieving factors.    No other c/o.  Past Medical History:  Diagnosis Date   Abnormal Pap smear of cervix    Anemia    on daily iron  med   Anxiety    Blood transfusion without reported diagnosis 2021   Elevated LDL cholesterol level 05/23/2020   Family history of colon cancer    Hyperlipidemia    on meds   Hypertension    on meds   Iron  deficiency 05/23/2020   Migraines    Ovarian cyst    Prediabetes 05/23/2020   not on meds-diet controlled   Thrombocytosis 05/23/2020   Current Outpatient Medications on File Prior to Visit  Medication Sig Dispense Refill   amLODipine  (NORVASC ) 5 MG tablet  Take 1 tablet (5 mg total) by mouth daily. 90 tablet 2   ferrous gluconate  (FERGON) 324 MG tablet Take 1 tablet (324 mg total) by mouth 2 (two) times daily with a meal. 180 tablet 2   rosuvastatin  (CRESTOR ) 10 MG tablet Take 1 tablet (10 mg total) by mouth daily. 90 tablet 3   Vitamin D , Ergocalciferol , (DRISDOL ) 1.25 MG (50000 UNIT) CAPS capsule Take 1 capsule (50,000 Units total) by mouth every 7 (seven) days. 12 capsule 3   No current facility-administered medications on file prior to visit.    The following portions of the patient's history were reviewed and updated as appropriate: allergies, current medications, past family history, past medical history, past social history, past surgical history and problem list.  ROS Otherwise as in subjective above    Objective: BP 110/64   Pulse 82   Wt 182 lb 12.8 oz (82.9 kg)   SpO2 100%   BMI 31.38 kg/m   General appearance: alert, no distress, well developed, well nourished Neck: supple, no lymphadenopathy, no thyromegaly, no masses Heart: RRR, normal S1, S2, no murmurs Lungs: CTA bilaterally, no wheezes, rhonchi, or rales Pulses: 2+ radial pulses, 2+ pedal pulses, normal cap refill Ext: +slight puffy edema of bilat ankles, no calve tendnerss or palpable cord   Assessment: Encounter Diagnoses  Name Primary?   Iron  deficiency Yes   Prediabetes    Primary hypertension  Vitamin D  deficiency    Hyperlipidemia, unspecified hyperlipidemia type    Leg swelling    BMI 31.0-31.9,adult    Morbid obesity (HCC)      Plan: Iron  deficiency Compliant with iron  typically twice daily 324 mg.  She has a long history of iron  deficiency including prior iron  infusions and she lived in [place]  -I reviewed her colonoscopy report on her phone.  She had a colonoscopy in November 2023 by Dr. Rosario Estefana Kidney.  She had internal hemorrhoids.  She was advised repeat in 5 years. -Updated labs today  Diabets - new diagnosis today -Referral to  weight loss study through [facility] with nutrition consult, exercise, monitoring and possible weight loss medication -if not accepted into study, will need to initiate medication  Hypertension - continue amlodipine  5 mg daily -Discussed possible side effects including ankle swelling which she is experiencing but likely not related to the medication  Vitamin D  deficiency - Continue vitamin D  supplement 50,000 weekly, initiated August 2025 -Updated labs today  Hyperlipidemia -She has been back on Crestor  10 mg daily since last visit August 2020 -Labs today  BMI greater than 30 -Referral to weight loss study through [facility]  Leg swelling - We discussed possible mild leg swelling.  I suspect her is related to dependent edema, venous insufficiency -Advised compression socks or compression hose, regular exercise, limit salt    Langley was seen today for follow-up.  Diagnoses and all orders for this visit:  Iron  deficiency -     Iron , TIBC and Ferritin Panel  Prediabetes -     Hemoglobin A1c  Primary hypertension  Vitamin D  deficiency -     VITAMIN D  25 Hydroxy (Vit-D Deficiency, Fractures)  Hyperlipidemia, unspecified hyperlipidemia type -     Lipid panel -     Comprehensive metabolic panel with GFR  Leg swelling -     Comprehensive metabolic panel with GFR  BMI 31.0-31.9,adult  Morbid obesity (HCC)   Follow up: pending labs

## 2024-02-28 ENCOUNTER — Other Ambulatory Visit: Payer: Self-pay | Admitting: Medical

## 2024-02-28 ENCOUNTER — Ambulatory Visit: Payer: Self-pay | Admitting: Medical

## 2024-02-28 LAB — LIPID PANEL
Cholesterol, Total: 149 mg/dL (ref 100–199)
HDL: 49 mg/dL (ref >39)
LDL CALC COMMENT:: 3 ratio (ref 0.0–4.4)
LDL Chol Calc (NIH): 81 mg/dL (ref 0–99)
Triglycerides: 102 mg/dL (ref 0–149)
VLDL Cholesterol Cal: 19 mg/dL (ref 5–40)

## 2024-02-28 LAB — COMPREHENSIVE METABOLIC PANEL WITH GFR
ALT: 30 IU/L (ref 0–32)
AST: 28 IU/L (ref 0–40)
Albumin: 4.2 g/dL (ref 3.8–4.9)
Alkaline Phosphatase: 93 IU/L (ref 49–135)
BUN/Creatinine Ratio: 22 (ref 9–23)
BUN: 11 mg/dL (ref 6–24)
Bilirubin Total: 0.4 mg/dL (ref 0.0–1.2)
CO2: 21 mmol/L (ref 20–29)
Calcium: 10 mg/dL (ref 8.7–10.2)
Chloride: 103 mmol/L (ref 96–106)
Creatinine, Ser: 0.51 mg/dL — AB (ref 0.57–1.00)
Globulin, Total: 2.8 g/dL (ref 1.5–4.5)
Glucose: 151 mg/dL — AB (ref 70–99)
Potassium: 4.2 mmol/L (ref 3.5–5.2)
Sodium: 140 mmol/L (ref 134–144)
Total Protein: 7 g/dL (ref 6.0–8.5)
eGFR: 110 mL/min/1.73 (ref >59)

## 2024-02-28 LAB — IRON,TIBC AND FERRITIN PANEL
Ferritin: 34 ng/mL (ref 15–150)
Iron Saturation: 13 % — AB (ref 15–55)
Iron: 50 ug/dL (ref 27–159)
Total Iron Binding Capacity: 399 ug/dL (ref 250–450)
UIBC: 349 ug/dL (ref 131–425)

## 2024-02-28 LAB — VITAMIN D 25 HYDROXY (VIT D DEFICIENCY, FRACTURES): Vit D, 25-Hydroxy: 38.7 ng/mL (ref 30.0–100.0)

## 2024-02-28 LAB — HEMOGLOBIN A1C
Est. average glucose Bld gHb Est-mCnc: 157 mg/dL
Hgb A1c MFr Bld: 7.1 % — ABNORMAL HIGH (ref 4.8–5.6)

## 2024-02-28 NOTE — Progress Notes (Signed)
 Results through MyChart

## 2024-03-10 ENCOUNTER — Other Ambulatory Visit: Payer: Self-pay | Admitting: Medical

## 2024-03-10 ENCOUNTER — Encounter (HOSPITAL_COMMUNITY): Payer: Self-pay

## 2024-03-10 ENCOUNTER — Other Ambulatory Visit (HOSPITAL_COMMUNITY): Payer: Self-pay

## 2024-03-10 ENCOUNTER — Telehealth (HOSPITAL_COMMUNITY): Payer: Self-pay

## 2024-03-10 MED ORDER — MOUNJARO 2.5 MG/0.5ML ~~LOC~~ SOAJ
2.5000 mg | SUBCUTANEOUS | 0 refills | Status: DC
  Filled 2024-03-10 – 2024-03-12 (×2): qty 2, 28d supply, fill #0

## 2024-03-10 NOTE — Telephone Encounter (Signed)
 Resent form for [employer]

## 2024-03-11 ENCOUNTER — Other Ambulatory Visit (HOSPITAL_COMMUNITY): Payer: Self-pay

## 2024-03-11 ENCOUNTER — Telehealth (HOSPITAL_COMMUNITY): Payer: Self-pay

## 2024-03-11 DIAGNOSIS — E1165 Type 2 diabetes mellitus with hyperglycemia: Secondary | ICD-10-CM | POA: Insufficient documentation

## 2024-03-11 NOTE — Telephone Encounter (Signed)
 PA request has been Received. New Encounter has been or will be created for follow up. For additional info see Pharmacy Prior Auth telephone encounter from 03/11/24.

## 2024-03-11 NOTE — Telephone Encounter (Signed)
 To help increase the likelihood of a successful prior authorization for this GLP1, please add a diagnosis of Type II diabetes to the patient's problem list, if clinically appropriate, and associate the diagnosis with a recent visit note. Thank you for your partnership.

## 2024-03-12 ENCOUNTER — Other Ambulatory Visit (HOSPITAL_COMMUNITY): Payer: Self-pay

## 2024-04-13 ENCOUNTER — Other Ambulatory Visit: Payer: Self-pay | Admitting: Medical

## 2024-04-13 ENCOUNTER — Other Ambulatory Visit (HOSPITAL_COMMUNITY): Payer: Self-pay

## 2024-04-13 MED ORDER — MOUNJARO 5 MG/0.5ML ~~LOC~~ SOAJ
5.0000 mg | SUBCUTANEOUS | 0 refills | Status: DC
  Filled 2024-04-13: qty 2, 28d supply, fill #0

## 2024-04-14 ENCOUNTER — Encounter (HOSPITAL_COMMUNITY): Payer: Self-pay

## 2024-04-14 ENCOUNTER — Other Ambulatory Visit: Payer: Self-pay

## 2024-04-14 ENCOUNTER — Other Ambulatory Visit (HOSPITAL_COMMUNITY): Payer: Self-pay

## 2024-05-11 ENCOUNTER — Other Ambulatory Visit: Payer: Self-pay | Admitting: Medical

## 2024-05-13 ENCOUNTER — Encounter (HOSPITAL_COMMUNITY): Payer: Self-pay

## 2024-05-13 ENCOUNTER — Other Ambulatory Visit (HOSPITAL_COMMUNITY): Payer: Self-pay

## 2024-05-13 MED ORDER — TIRZEPATIDE 7.5 MG/0.5ML ~~LOC~~ SOAJ
7.5000 mg | SUBCUTANEOUS | 0 refills | Status: AC
  Filled 2024-05-13: qty 2, 28d supply, fill #0
